# Patient Record
Sex: Female | Born: 1940
Health system: Southern US, Community
[De-identification: ages and names within clinical notes are randomized; demographics above are authoritative.]

## PROBLEM LIST (undated history)

## (undated) DIAGNOSIS — E7800 Pure hypercholesterolemia, unspecified: Secondary | ICD-10-CM

## (undated) DIAGNOSIS — F419 Anxiety disorder, unspecified: Secondary | ICD-10-CM

## (undated) DIAGNOSIS — E2839 Other primary ovarian failure: Secondary | ICD-10-CM

## (undated) DIAGNOSIS — H409 Unspecified glaucoma: Secondary | ICD-10-CM

## (undated) DIAGNOSIS — M1990 Unspecified osteoarthritis, unspecified site: Secondary | ICD-10-CM

## (undated) DIAGNOSIS — Z973 Presence of spectacles and contact lenses: Secondary | ICD-10-CM

## (undated) DIAGNOSIS — R55 Syncope and collapse: Secondary | ICD-10-CM

## (undated) DIAGNOSIS — M810 Age-related osteoporosis without current pathological fracture: Secondary | ICD-10-CM

## (undated) DIAGNOSIS — M8580 Other specified disorders of bone density and structure, unspecified site: Secondary | ICD-10-CM

## (undated) DIAGNOSIS — K5790 Diverticulosis of intestine, part unspecified, without perforation or abscess without bleeding: Secondary | ICD-10-CM

## (undated) DIAGNOSIS — I10 Essential (primary) hypertension: Secondary | ICD-10-CM

## (undated) DIAGNOSIS — G479 Sleep disorder, unspecified: Secondary | ICD-10-CM

## (undated) DIAGNOSIS — I447 Left bundle-branch block, unspecified: Secondary | ICD-10-CM

## (undated) HISTORY — DX: Essential (primary) hypertension: I10

## (undated) HISTORY — DX: Pure hypercholesterolemia, unspecified: E78.00

## (undated) HISTORY — DX: Anxiety disorder, unspecified: F41.9

## (undated) HISTORY — DX: Syncope and collapse: R55

## (undated) HISTORY — DX: Other primary ovarian failure: E28.39

## (undated) HISTORY — DX: Sleep disorder, unspecified: G47.9

## (undated) HISTORY — DX: Diverticulosis of intestine, part unspecified, without perforation or abscess without bleeding: K57.90

## (undated) HISTORY — PX: SHOULDER OPEN ROTATOR CUFF REPAIR: SHX2407

## (undated) HISTORY — DX: Other specified disorders of bone density and structure, unspecified site: M85.80

---
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
## 1991-07-24 HISTORY — PX: CERVICAL LAMINECTOMY: SHX94

## 1995-07-24 HISTORY — PX: LAPAROSCOPIC CHOLECYSTECTOMY: SUR755

## 2000-08-23 HISTORY — PX: COLONOSCOPY: SHX174

## 2001-07-23 HISTORY — PX: SHOULDER OPEN ROTATOR CUFF REPAIR: SHX2407

## 2007-03-25 ENCOUNTER — Encounter: Admission: RE | Admit: 2007-03-25 | Discharge: 2007-03-25 | Payer: Self-pay | Admitting: Orthopedic Surgery

## 2011-06-01 DIAGNOSIS — I447 Left bundle-branch block, unspecified: Secondary | ICD-10-CM | POA: Insufficient documentation

## 2014-07-30 DIAGNOSIS — M8580 Other specified disorders of bone density and structure, unspecified site: Secondary | ICD-10-CM | POA: Diagnosis not present

## 2014-07-30 DIAGNOSIS — G4700 Insomnia, unspecified: Secondary | ICD-10-CM | POA: Diagnosis not present

## 2014-07-30 DIAGNOSIS — E780 Pure hypercholesterolemia: Secondary | ICD-10-CM | POA: Diagnosis not present

## 2014-07-30 DIAGNOSIS — I10 Essential (primary) hypertension: Secondary | ICD-10-CM | POA: Diagnosis not present

## 2014-07-30 DIAGNOSIS — Z0000 Encounter for general adult medical examination without abnormal findings: Secondary | ICD-10-CM | POA: Diagnosis not present

## 2014-09-02 DIAGNOSIS — K5732 Diverticulitis of large intestine without perforation or abscess without bleeding: Secondary | ICD-10-CM | POA: Diagnosis not present

## 2014-10-12 DIAGNOSIS — Z1231 Encounter for screening mammogram for malignant neoplasm of breast: Secondary | ICD-10-CM | POA: Diagnosis not present

## 2015-06-07 DIAGNOSIS — Z0000 Encounter for general adult medical examination without abnormal findings: Secondary | ICD-10-CM | POA: Diagnosis not present

## 2015-08-05 DIAGNOSIS — H5210 Myopia, unspecified eye: Secondary | ICD-10-CM | POA: Diagnosis not present

## 2015-08-05 DIAGNOSIS — H5203 Hypermetropia, bilateral: Secondary | ICD-10-CM | POA: Diagnosis not present

## 2015-08-05 DIAGNOSIS — H401132 Primary open-angle glaucoma, bilateral, moderate stage: Secondary | ICD-10-CM | POA: Diagnosis not present

## 2015-08-11 DIAGNOSIS — Z23 Encounter for immunization: Secondary | ICD-10-CM | POA: Diagnosis not present

## 2015-08-11 DIAGNOSIS — Z79899 Other long term (current) drug therapy: Secondary | ICD-10-CM | POA: Diagnosis not present

## 2015-08-11 DIAGNOSIS — E785 Hyperlipidemia, unspecified: Secondary | ICD-10-CM | POA: Diagnosis not present

## 2015-08-11 DIAGNOSIS — I10 Essential (primary) hypertension: Secondary | ICD-10-CM | POA: Diagnosis not present

## 2015-08-11 DIAGNOSIS — Z1389 Encounter for screening for other disorder: Secondary | ICD-10-CM | POA: Diagnosis not present

## 2015-08-11 DIAGNOSIS — Z0000 Encounter for general adult medical examination without abnormal findings: Secondary | ICD-10-CM | POA: Diagnosis not present

## 2015-08-25 DIAGNOSIS — L578 Other skin changes due to chronic exposure to nonionizing radiation: Secondary | ICD-10-CM | POA: Diagnosis not present

## 2015-08-25 DIAGNOSIS — L728 Other follicular cysts of the skin and subcutaneous tissue: Secondary | ICD-10-CM | POA: Diagnosis not present

## 2015-08-25 DIAGNOSIS — L820 Inflamed seborrheic keratosis: Secondary | ICD-10-CM | POA: Diagnosis not present

## 2015-11-22 DIAGNOSIS — Z1231 Encounter for screening mammogram for malignant neoplasm of breast: Secondary | ICD-10-CM | POA: Diagnosis not present

## 2015-12-08 DIAGNOSIS — G4700 Insomnia, unspecified: Secondary | ICD-10-CM | POA: Diagnosis not present

## 2015-12-08 DIAGNOSIS — Z1389 Encounter for screening for other disorder: Secondary | ICD-10-CM | POA: Diagnosis not present

## 2016-03-13 DIAGNOSIS — N3091 Cystitis, unspecified with hematuria: Secondary | ICD-10-CM | POA: Diagnosis not present

## 2016-04-22 DIAGNOSIS — R55 Syncope and collapse: Secondary | ICD-10-CM

## 2016-04-22 DIAGNOSIS — Z87898 Personal history of other specified conditions: Secondary | ICD-10-CM

## 2016-04-22 HISTORY — DX: Syncope and collapse: R55

## 2016-04-22 HISTORY — DX: Personal history of other specified conditions: Z87.898

## 2016-05-21 DIAGNOSIS — R55 Syncope and collapse: Secondary | ICD-10-CM | POA: Diagnosis not present

## 2016-05-21 DIAGNOSIS — E785 Hyperlipidemia, unspecified: Secondary | ICD-10-CM | POA: Diagnosis not present

## 2016-05-22 ENCOUNTER — Telehealth: Payer: Self-pay | Admitting: Cardiology

## 2016-05-22 NOTE — Telephone Encounter (Signed)
Received records from [facility] for appointment on 05/31/16 with Dr Ellyn Hack.  Records given to [facility] (medical records) for Dr Allison Quarry schedule on 05/31/16. lp

## 2016-05-29 ENCOUNTER — Encounter: Payer: Self-pay | Admitting: *Deleted

## 2016-05-29 ENCOUNTER — Other Ambulatory Visit: Payer: Self-pay | Admitting: *Deleted

## 2016-05-31 ENCOUNTER — Ambulatory Visit (INDEPENDENT_AMBULATORY_CARE_PROVIDER_SITE_OTHER): Payer: Commercial Managed Care - HMO | Admitting: Cardiology

## 2016-05-31 ENCOUNTER — Encounter: Payer: Self-pay | Admitting: Cardiology

## 2016-05-31 VITALS — BP 154/78 | HR 56 | Ht 65.0 in | Wt 154.0 lb

## 2016-05-31 DIAGNOSIS — I208 Other forms of angina pectoris: Secondary | ICD-10-CM

## 2016-05-31 DIAGNOSIS — R55 Syncope and collapse: Secondary | ICD-10-CM | POA: Diagnosis not present

## 2016-05-31 DIAGNOSIS — R0789 Other chest pain: Secondary | ICD-10-CM | POA: Insufficient documentation

## 2016-05-31 DIAGNOSIS — I447 Left bundle-branch block, unspecified: Secondary | ICD-10-CM

## 2016-05-31 NOTE — Patient Instructions (Signed)
SCHEDULE AT [place] has requested that you have an echocardiogram. Echocardiography is a painless test that uses sound waves to create images of your heart. It provides your doctor with information about the size and shape of your heart and how well your heart's chambers and valves are working. This procedure takes approximately one hour. There are no restrictions for this procedure.   SCHEDULE AT [facility] Your physician has requested that you have a carotid duplex. This test is an ultrasound of the carotid arteries in your neck. It looks at blood flow through these arteries that supply the brain with blood. Allow one hour for this exam. There are no restrictions or special instructions.  Your physician has requested that you have a lexiscan myoview. For further information please visit [email or web address]. Please follow instruction sheet, as given.   NO OTHER CHANGES  Your physician recommends that you schedule a follow-up appointment in: [place].

## 2016-05-31 NOTE — Progress Notes (Signed)
PCP: Ronita Hipps, MD  Clinic Note: Chief Complaint  Patient presents with  . Follow-up    New patient. Eval for syncope and collapse. Dr. Helene Kelp ref.    HPI: Leslie Parsons is a 75 y.o. female with a PMH below who presents today for Cardiology evaluation after an episode of syncope associated with neck and jaw pain during exercise. She has chronic left bundle-branch block that was first noted in 2012.Marland Kitchen  Leslie Parsons was seen on October 30 by her PCP following an episode of syncope. This occurred while doing yoga at the [facility]. She was having neck pain with doing arm lifting exercises.  This symptom that she describes occurred when she was doing exercises using rubber straps that required her to hold her arms up and strap. She initially started feeling some discomfort in the side of her neck and back. She tried to stretch, and restart only to have the symptom recur and can go into her jaw. She then started feeling nauseated and dizzy. She felt flushed as though she may pass out. When she turned around to walk out of the room to get some fresh air and water, she passed out before getting out of the door. She apparently lost consciousness for roughly 3-4 minutes. She was evaluated by a physician and have seen through indicated that she never lost pulse. She refused to come to the emergency room because she woke up and felt better. She had no postictal symptoms, but does not have any recollection or memory of anything that happened after she started feeling nauseated. EMS evaluation demonstrated a left bundle branch block on her EKG. They have the past and seen from [facility] and EKG the showed left bundle branch block from 2012 (scanned in the chart)  She was not hospitalized, has not had any studies.  Interval History: Since this episode, she has not had any further symptoms. She has been gone back to doing her routine yoga routine with her daughter. She has not had any further neck or  jaw discomfort. She denies any resting or exertional dyspnea or chest tightness or pressure. Besides having bilateral varicose veins or legs that are a little bit uncomfortable, she really does well she doesn't really have any edema. Certainly no PND or orthopnea. No palpitations or recurrent syncope/near syncope. No TIA/amaurosis fugax symptoms.  ROS: A comprehensive was performed. Review of Systems  Constitutional: Negative for chills, fever, malaise/fatigue and weight loss.  HENT: Negative for congestion and nosebleeds.   Respiratory: Negative.   Cardiovascular: Positive for leg swelling (Mild swelling with bilateral varicose veins that are a little bit sore, but no leg heaviness, restless leg symptoms or wounds. ).       Per history of present illness  Gastrointestinal: Positive for nausea (Only per history of present illness). Negative for abdominal pain, blood in stool and melena.  Genitourinary: Negative for dysuria, frequency and hematuria.  Musculoskeletal: Positive for myalgias (Only the neck, shoulder and jaw pain at the time of her syncopal episode). Negative for joint pain.  Skin: Negative.        No venous stasis dermatitis changes.  Neurological: Positive for loss of consciousness (As described in history of present illness). Negative for focal weakness and headaches.       Since the syncopal episode, no further symptoms.  Psychiatric/Behavioral: Negative for depression and memory loss. The patient is not nervous/anxious and does not have insomnia.   All other systems reviewed and are negative.  Past Medical History:  Diagnosis Date  . Anxiety   . Diverticulosis   . Hypertension   . Osteopenia   . Ovarian failure   . Pure hypercholesterolemia   . Sleep disorder   . Syncope and collapse 04/2016   Unclear etiology    Past Surgical History:  Procedure Laterality Date  . APPENDECTOMY  1948  . CERVICAL LAMINECTOMY  1993  . COLONOSCOPY  08/2000  . LAPAROSCOPIC  CHOLECYSTECTOMY  1997  . SHOULDER OPEN ROTATOR CUFF REPAIR  2003   Prior to Admission medications   Medication Sig Start Date End Date Taking? Authorizing Provider  benazepril-hydrochlorthiazide (LOTENSIN HCT) 20-12.5 MG tablet Take 1 tablet by mouth daily.   Yes Historical Provider, MD  latanoprost (XALATAN) 0.005 % ophthalmic solution Place 1 drop into both eyes at bedtime.   Yes Historical Provider, MD  LORazepam (ATIVAN) 0.5 MG tablet Take 0.5 mg by mouth daily as needed for anxiety.   Yes Historical Provider, MD   Allergies  Allergen Reactions  . Codeine Other (See Comments)    Doesn't tolerate    Social History   Social History  . Marital status: Unknown    Spouse name: N/A  . Number of children: 4  . Years of education: 12   Occupational History  . [place] - retired   Social History Main Topics  . Smoking status: Never Smoker  . Smokeless tobacco: Never Used  . Alcohol use No  . Drug use: No  . Sexual activity: Not Asked   Other Topics Concern  . 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 She never smoked and does not drink. She exercises 5 days of the week with her daughter at the [facility] (oftentimes doing yoga or other exercise classes.   She does have a living will and power of attorney. Per her PCPs records she has been listed as DO NOT RESUSCITATE.    Family History  Problem Relation Age of Onset  . Heart disease Mother   . Heart failure Mother   . Emphysema Father 64  . Alzheimer's disease Sister   . Coronary artery disease Sister     History of PCI in 2008  . CVA Brother 28  . Alzheimer's disease Maternal Grandmother   . Stroke Paternal Grandmother   . Cancer Sister     Wt Readings from Last 3 Encounters:  05/31/16 69.9 kg (154 lb)    PHYSICAL EXAM BP (!) 154/78   Pulse (!) 56   Ht 5\' 5"  (1.651 m)   Wt 69.9 kg (154 lb)   BMI  25.63 kg/m  General appearance: alert, cooperative, appears stated age, no distress and L3 appearing/well-nourished and well-groomed HEENT: [place]/AT, EOMI, MMM, anicteric sclera Neck: no adenopathy, no carotid bruit and no JVD Lungs: clear to auscultation bilaterally, normal percussion bilaterally and non-labored Heart: regular rate and rhythm, S1 normal and split S2, no murmur, click, rub or gallop; nondisplaced PMI Abdomen: soft, non-tender; bowel sounds normal; no masses,  no organomegaly; no HJR Extremities: extremities normal, atraumatic, no cyanosis, and edema -trace Pulses: 2+ and symmetric;  Skin: mobility and turgor normal, no evidence of bleeding or bruising and no lesions noted or varicosities - lower leg(s) Mildly engorged, but nontender Neurologic: Mental status: Alert, oriented, thought content appropriate; normal, steady gait Cranial nerves: normal (II-XII grossly intact)   Adult ECG  Report  Rate: 54 ;  Rhythm: sinus bradycardia and LBBB. Normal axis, intervals and durations.;   Narrative Interpretation: Relatively stable EKG compared to her EKG from 2012 which showed almost identical findings with bradycardia 56 beats minute.   Other studies Reviewed: Additional studies/ records that were reviewed today include:  Recent Labs: 05/21/2016 -see report scanned in chart  Na+ 139, K+ 4.2, Cl- 103, HCO3- 29 , BUN 17, Cr 1.18, Glu 103, Ca2+ 9.5; AST 15, ALT 14, AlkP 51, Alb 4.5, TP 6.8, T Bili 0.5  CBC: W 4.4, H/H 13.2/36.7, Plt 235 K;;; TSH 0.819  TC 220, TG 92, HDL 84, LDL 118   ASSESSMENT / PLAN: Problem List Items Addressed This Visit    Left bundle branch block (LBBB) on electrocardiogram (Chronic)    This appears to be a long-standing finding, however she has never had cardiac evaluation. Would like to make sure that she does not have evidence of cardiomyopathy.   Plan: 2-D echocardiogram      Relevant Orders   EKG 12-Lead   Myocardial Perfusion Imaging    ECHOCARDIOGRAM COMPLETE   VAS US CAROTID   Syncope and collapse - Primary    Very difficult to determine the true etiology for her syncope. Most likely is consistent with strain related muscle skeletal pain leading to a vasovagal episode. This is probably exacerbated by the fact that it was early morning and she had not had much to eat or drink. However given the fact that there was neck and jaw pain with exertion followed by nausea and collapse, we cannot exclude an ischemic arrhythmic event. Can also not exclude subclavian steal. With her left bundle branch block, we cannot exclude cardiomyopathy. Plan: Myoview - to evaluate for ischemia, echocardiogram to evaluate cardiomyopathy from left bundle branch block, carotid/upper extremity Dopplers to exclude subclavian steal      Relevant Medications   benazepril-hydrochlorthiazide (LOTENSIN HCT) 20-12.5 MG tablet   Other Relevant Orders   EKG 12-Lead   Myocardial Perfusion Imaging   ECHOCARDIOGRAM COMPLETE   VAS US CAROTID   Exertional angina (HCC) - cannot exclude with neck pain preceding syncope     It's hard to tell what caused her neck and jaw pain. Probably was related to the exercise she was doing it is musculoskeletal, however the fact that it was then associated with a syncopal episode, we cannot exclude it being a potential anginal equivalent. She does have an abnormal EKG with left bundle branch block as well as long-standing hypertension and family history of heart disease.  Plan: Evaluate for ischemia with Myoview       Relevant Medications   benazepril-hydrochlorthiazide (LOTENSIN HCT) 20-12.5 MG tablet   Other Relevant Orders   EKG 12-Lead   Myocardial Perfusion Imaging   ECHOCARDIOGRAM COMPLETE   VAS US CAROTID      Current medicines are reviewed at length with the patient today. (+/- concerns) None The following changes have been made:   Patient Instructions  SCHEDULE AT [facility] has requested that you have an echocardiogram. Echocardiography is a painless test that uses sound waves to create images of your heart. It provides your doctor with information about the size and shape of your heart and how well your heart's chambers and valves are working. This procedure takes approximately one hour. There are no restrictions for this procedure.   SCHEDULE AT [place] Your physician has requested that you have a carotid  duplex. This test is an ultrasound of the carotid arteries in your neck. It looks at blood flow through these arteries that supply the brain with blood. Allow one hour for this exam. There are no restrictions or special instructions.  Your physician has requested that you have a lexiscan myoview. For further information please visit [email or web address]. Please follow instruction sheet, as given.   NO OTHER CHANGES  Your physician recommends that you schedule a follow-up appointment in: [place].    Studies Ordered:   Orders Placed This Encounter  Procedures  . Myocardial Perfusion Imaging  . EKG 12-Lead  . ECHOCARDIOGRAM COMPLETE      Glenetta Hew, M.D., M.S. Interventional Cardiologist   Pager # [phone] Phone # [phone] [address] [place], [place] [address]

## 2016-06-01 ENCOUNTER — Encounter: Payer: Self-pay | Admitting: Cardiology

## 2016-06-01 NOTE — Assessment & Plan Note (Signed)
Very difficult to determine the true etiology for her syncope. Most likely is consistent with strain related muscle skeletal pain leading to a vasovagal episode. This is probably exacerbated by the fact that it was early morning and she had not had much to eat or drink. However given the fact that there was neck and jaw pain with exertion followed by nausea and collapse, we cannot exclude an ischemic arrhythmic event. Can also not exclude subclavian steal. With her left bundle branch block, we cannot exclude cardiomyopathy. Plan: Myoview - to evaluate for ischemia, echocardiogram to evaluate cardiomyopathy from left bundle branch block, carotid/upper extremity Dopplers to exclude subclavian steal

## 2016-06-01 NOTE — Assessment & Plan Note (Signed)
This appears to be a long-standing finding, however she has never had cardiac evaluation. Would like to make sure that she does not have evidence of cardiomyopathy.   Plan: 2-D echocardiogram

## 2016-06-01 NOTE — Assessment & Plan Note (Signed)
It's hard to tell what caused her neck and jaw pain. Probably was related to the exercise she was doing it is musculoskeletal, however the fact that it was then associated with a syncopal episode, we cannot exclude it being a potential anginal equivalent. She does have an abnormal EKG with left bundle branch block as well as long-standing hypertension and family history of heart disease.  Plan: Evaluate for ischemia with Myoview

## 2016-06-07 ENCOUNTER — Encounter: Payer: Self-pay | Admitting: *Deleted

## 2016-06-26 ENCOUNTER — Telehealth (HOSPITAL_COMMUNITY): Payer: Self-pay

## 2016-06-26 NOTE — Telephone Encounter (Signed)
Encounter complete. 

## 2016-06-27 ENCOUNTER — Ambulatory Visit (HOSPITAL_COMMUNITY): Payer: Commercial Managed Care - HMO | Attending: Cardiology

## 2016-06-27 ENCOUNTER — Other Ambulatory Visit: Payer: Self-pay

## 2016-06-27 DIAGNOSIS — I208 Other forms of angina pectoris: Secondary | ICD-10-CM | POA: Insufficient documentation

## 2016-06-27 DIAGNOSIS — I272 Pulmonary hypertension, unspecified: Secondary | ICD-10-CM | POA: Diagnosis not present

## 2016-06-27 DIAGNOSIS — R55 Syncope and collapse: Secondary | ICD-10-CM | POA: Diagnosis not present

## 2016-06-27 DIAGNOSIS — I447 Left bundle-branch block, unspecified: Secondary | ICD-10-CM | POA: Diagnosis not present

## 2016-06-27 DIAGNOSIS — I2089 Other forms of angina pectoris: Secondary | ICD-10-CM

## 2016-06-28 ENCOUNTER — Ambulatory Visit (HOSPITAL_COMMUNITY)
Admission: RE | Admit: 2016-06-28 | Discharge: 2016-06-28 | Disposition: A | Discharge status: Home / Self Care | Payer: Commercial Managed Care - HMO | Source: Ambulatory Visit | Attending: Cardiology | Admitting: Cardiology

## 2016-06-28 ENCOUNTER — Other Ambulatory Visit: Payer: Self-pay | Admitting: Cardiology

## 2016-06-28 DIAGNOSIS — R55 Syncope and collapse: Secondary | ICD-10-CM | POA: Diagnosis not present

## 2016-06-28 DIAGNOSIS — I447 Left bundle-branch block, unspecified: Secondary | ICD-10-CM | POA: Diagnosis not present

## 2016-06-28 DIAGNOSIS — I208 Other forms of angina pectoris: Secondary | ICD-10-CM | POA: Diagnosis not present

## 2016-06-28 DIAGNOSIS — I6523 Occlusion and stenosis of bilateral carotid arteries: Secondary | ICD-10-CM | POA: Diagnosis not present

## 2016-06-28 LAB — MYOCARDIAL PERFUSION IMAGING
CHL CUP NUCLEAR SDS: 3
CHL CUP NUCLEAR SRS: 7
CHL CUP NUCLEAR SSS: 10
CHL CUP RESTING HR STRESS: 61 {beats}/min
CHL CUP STRESS STAGE 1 DBP: 86 mmHg
CHL CUP STRESS STAGE 1 GRADE: 0 %
CHL CUP STRESS STAGE 1 HR: 56 {beats}/min
CHL CUP STRESS STAGE 1 SBP: 150 mmHg
CHL CUP STRESS STAGE 1 SPEED: 0 mph
CHL CUP STRESS STAGE 2 GRADE: 0 %
CHL CUP STRESS STAGE 2 HR: 56 {beats}/min
CHL CUP STRESS STAGE 3 DBP: 63 mmHg
CHL CUP STRESS STAGE 3 GRADE: 0 %
CHL CUP STRESS STAGE 4 DBP: 73 mmHg
CHL CUP STRESS STAGE 4 SBP: 146 mmHg
CHL CUP STRESS STAGE 4 SPEED: 0 mph
Estimated workload: 1 METS
LV dias vol: 82 mL (ref 46–106)
LV sys vol: 37 mL
Peak BP: 153 mmHg
Peak HR: 87 {beats}/min
Percent of predicted max HR: 60 %
Stage 2 Speed: 0 mph
Stage 3 HR: 87 {beats}/min
Stage 3 SBP: 153 mmHg
Stage 3 Speed: 0 mph
Stage 4 Grade: 0 %
Stage 4 HR: 73 {beats}/min
TID: 0.99

## 2016-06-28 MED ORDER — TECHNETIUM TC 99M TETROFOSMIN IV KIT
10.8000 | PACK | Freq: Once | INTRAVENOUS | Status: AC | PRN
  Administered 2016-06-28: 10.8 via INTRAVENOUS
  Filled 2016-06-28: qty 11

## 2016-06-28 MED ORDER — TECHNETIUM TC 99M TETROFOSMIN IV KIT
32.7000 | PACK | Freq: Once | INTRAVENOUS | Status: AC | PRN
  Administered 2016-06-28: 32.7 via INTRAVENOUS
  Filled 2016-06-28: qty 33

## 2016-06-28 MED ORDER — REGADENOSON 0.4 MG/5ML IV SOLN
0.4000 mg | Freq: Once | INTRAVENOUS | Status: AC
  Administered 2016-06-28: 0.4 mg via INTRAVENOUS

## 2016-07-04 DIAGNOSIS — Z23 Encounter for immunization: Secondary | ICD-10-CM | POA: Diagnosis not present

## 2016-07-10 ENCOUNTER — Telehealth: Payer: Self-pay | Admitting: *Deleted

## 2016-07-10 NOTE — Telephone Encounter (Signed)
LEFT MESSAGE ON BOTH [facility] MAIL TO CALL BACK - IN REGARDS TO CAROTID RESULTS.

## 2016-07-10 NOTE — Telephone Encounter (Signed)
Pt returned call to [facility] regarding Carotid results-pls call [phone]

## 2016-07-10 NOTE — Telephone Encounter (Signed)
Spoke to patient. Result given . Verbalized understanding  

## 2016-07-10 NOTE — Telephone Encounter (Signed)
-----   Message from Leonie Man, MD sent at 07/04/2016 11:08 PM EST ----- Mild bilateral carotid artery plaque.  No significant blockages.  DH  pls forward to Dr. Kennith Maes

## 2016-07-11 ENCOUNTER — Ambulatory Visit (INDEPENDENT_AMBULATORY_CARE_PROVIDER_SITE_OTHER): Payer: Commercial Managed Care - HMO | Admitting: Cardiology

## 2016-07-11 ENCOUNTER — Encounter: Payer: Self-pay | Admitting: Cardiology

## 2016-07-11 VITALS — BP 122/82 | HR 64 | Ht 65.0 in | Wt 157.4 lb

## 2016-07-11 DIAGNOSIS — I447 Left bundle-branch block, unspecified: Secondary | ICD-10-CM

## 2016-07-11 DIAGNOSIS — R55 Syncope and collapse: Secondary | ICD-10-CM | POA: Diagnosis not present

## 2016-07-11 DIAGNOSIS — R0789 Other chest pain: Secondary | ICD-10-CM | POA: Diagnosis not present

## 2016-07-11 NOTE — Progress Notes (Signed)
PCP: Ronita Hipps, MD  Clinic Note: Chief Complaint  Patient presents with  . Follow-up    patient reports no complaints    HPI: Leslie Parsons is a 75 y.o. female with a PMH below who presents today for 1 month follow-up after carotid ultrasound and myocardial perfusion scan.  Leslie Parsons was last seen on November 9 for initial cardiology consultation. She was doing stretching exercises to prevent. She apparently had passed out 3-4 minutes. EKG demonstrated known left bundle branch block.  Recent Hospitalizations: None  Studies Reviewed: December 6 and 7 2017  Myoview: Mildly reduced EF ~54%. Medium sized moderate severity defect in the basal anteroseptal, basal inferoseptal, mid inferoseptal and mid anteroseptal wall as well as anteroapical and apical septal wall that was not reversible. Likely relating to breast attenuation versus bundle branch block. LOW Risk  2-D echo: EF roughly 45% with septal synchrony and diffuse hypokinesis - likely related to bundle branch block. Mildly elevated PA pressures  Carotid Dopplers: No significant stenoses. Mild bilateral plaque.  Interval History:  Leslie Parsons returns today for follow-up after her studies. Despite having the recent scary episode, she and her daughter gone back to doing her routine exercise, and she has not had any further symptoms of chest tightness, pressure or syncope/near syncope. She is exercising without difficulty. No resting or exertional dyspnea.  No PND, orthopnea or edema. No palpitations, lightheadedness, dizziness, weakness or TIA/amaurosis fugax symptoms. No claudication.  She really is doing well with no active symptoms.  ROS: A comprehensive was performed. Review of Systems  Constitutional: Negative.  Negative for malaise/fatigue.  HENT: Negative for congestion and sinus pain.   Respiratory: Negative.  Negative for cough and shortness of breath.   Cardiovascular: Positive for leg swelling ( (Mild swelling  with bilateral varicose veins that are a little bit sore, but no leg heaviness, restless leg symptoms or wounds. ). ).  Gastrointestinal: Negative for blood in stool, constipation, heartburn and melena.  Genitourinary: Negative.  Negative for hematuria.  Musculoskeletal: Negative for joint pain and myalgias.  Neurological: Negative.  Negative for dizziness.  Psychiatric/Behavioral: Negative.  Negative for memory loss.  All other systems reviewed and are negative.   Past Medical History:  Diagnosis Date  . Anxiety   . Diverticulosis   . Hypertension   . Osteopenia   . Ovarian failure   . Pure hypercholesterolemia   . Sleep disorder   . Syncope and collapse 04/2016   Unclear etiology    Past Surgical History:  Procedure Laterality Date  . APPENDECTOMY  1948  . CERVICAL LAMINECTOMY  1993  . COLONOSCOPY  08/2000  . LAPAROSCOPIC CHOLECYSTECTOMY  1997  . SHOULDER OPEN ROTATOR CUFF REPAIR  2003    Current Meds  Medication Sig  . benazepril-hydrochlorthiazide (LOTENSIN HCT) 20-12.5 MG tablet Take 1 tablet by mouth daily.  Marland Kitchen latanoprost (XALATAN) 0.005 % ophthalmic solution Place 1 drop into both eyes at bedtime.  Marland Kitchen LORazepam (ATIVAN) 0.5 MG tablet Take 0.5 mg by mouth daily as needed for anxiety.    Allergies  Allergen Reactions  . Codeine Other (See Comments)    Doesn't tolerate    Social History   Social History  . Marital status: Unknown    Spouse name: N/A  . Number of children: 4  . Years of education: 12   Occupational History  . [employer] - retired   Social History Main Topics  . Smoking status: Never  Smoker  . Smokeless tobacco: Never Used  . Alcohol use No  . Drug use: No  . Sexual activity: Not Asked   Other Topics Concern  . 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 She never smoked and does not drink. She exercises 5 days of the  week with her daughter at the [facility] (oftentimes doing yoga or other exercise classes.   She does have a living will and power of attorney. Per her PCPs records she has been listed as DO NOT RESUSCITATE.    family history includes Alzheimer's disease in her maternal grandmother and sister; CVA (age of onset: 15) in her brother; Cancer in her sister; Coronary artery disease in her sister; Emphysema (age of onset: 7) in her father; Heart disease in her mother; Heart failure in her mother; Stroke in her paternal grandmother.  Wt Readings from Last 3 Encounters:  07/11/16 71.4 kg (157 lb 6.4 oz)  06/28/16 69.9 kg (154 lb)  05/31/16 69.9 kg (154 lb)    PHYSICAL EXAM BP 122/82   Pulse 64   Ht 5\' 5"  (1.651 m)   Wt 71.4 kg (157 lb 6.4 oz)   BMI 26.19 kg/m  General appearance: alert, cooperative, appears stated age, no distress and L3 appearing/well-nourished and well-groomed Neck: no adenopathy, no carotid bruit and no JVD Lungs: clear to auscultation bilaterally, normal percussion bilaterally and non-labored Heart: regular rate and rhythm, S1 normal and split S2, no murmur, click, rub or gallop; nondisplaced PMI Abdomen: soft, non-tender; bowel sounds normal; no masses,  no organomegaly; no HJR Extremities: extremities normal, atraumatic, no cyanosis, and edema -trace Pulses: 2+ and symmetric;  Neurologic: Mental status: Alert, oriented, thought content appropriate; normal, steady gait    Adult ECG Report n/a  Other studies Reviewed: Additional studies/ records that were reviewed today include:  Recent Labs:   No results found for: CHOL, HDL, LDLCALC, LDLDIRECT, TRIG, CHOLHDL    ASSESSMENT / PLAN: Unable to really determine what the cause of her symptoms otherwise. At least we know it was probably not lately ischemic heart disease or carotid/subclavian disease.  So far, her workup is negative. I think she is fine returning to the care of his primary physician.  Problem List Items  Addressed This Visit    Left bundle branch block (LBBB) on electrocardiogram (Chronic)    Only septal to syncope noted on echo with mild diffuse hypokinesis from that. Related to bundle branch block.      Syncope and collapse - Primary    No further episodes. Probably related to exertion and then extreme pain leading to vasovagal episode. This is in the setting of also probably being dehydrated after exercise. Carotid Dopplers did not show any evidence of stenosis to suggest subclavian steal. Normal echo and normal stress test.       Atypical chest pain    Negative Myoview. This would suggest that her symptoms probably not angina and probably was simply just musculoskeletal neck pain.  Myoview only suggested septal dropout from bundle branch block. No evidence of ischemia.         Current medicines are reviewed at length with the patient today. (+/- concerns) None The following changes have been made: None  Patient Instructions  NO CHANGES WITH CURRENT TREATMENT  Your physician recommends that you schedule a follow-up appointment on an as needed basis  Studies Ordered:   No orders of  the defined types were placed in this encounter.    Glenetta Hew, M.D., M.S. Interventional Cardiologist   Pager # [phone] Phone # [phone] [address] [place], [place] [address]

## 2016-07-11 NOTE — Patient Instructions (Addendum)
NO CHANGES WITH CURRENT TREATMENT    Your physician recommends that you schedule a follow-up appointment on an as needed basis.   

## 2016-07-13 ENCOUNTER — Encounter: Payer: Self-pay | Admitting: Cardiology

## 2016-07-13 NOTE — Assessment & Plan Note (Addendum)
Negative Myoview. This would suggest that her symptoms probably not angina and probably was simply just musculoskeletal neck pain.  Myoview only suggested septal dropout from bundle branch block. No evidence of ischemia.

## 2016-07-13 NOTE — Assessment & Plan Note (Signed)
Only septal to syncope noted on echo with mild diffuse hypokinesis from that. Related to bundle branch block.

## 2016-07-13 NOTE — Assessment & Plan Note (Signed)
No further episodes. Probably related to exertion and then extreme pain leading to vasovagal episode. This is in the setting of also probably being dehydrated after exercise. Carotid Dopplers did not show any evidence of stenosis to suggest subclavian steal. Normal echo and normal stress test.

## 2016-10-25 DIAGNOSIS — H524 Presbyopia: Secondary | ICD-10-CM | POA: Diagnosis not present

## 2016-10-31 DIAGNOSIS — M1712 Unilateral primary osteoarthritis, left knee: Secondary | ICD-10-CM | POA: Diagnosis not present

## 2016-11-29 DIAGNOSIS — H1045 Other chronic allergic conjunctivitis: Secondary | ICD-10-CM | POA: Diagnosis not present

## 2016-12-25 DIAGNOSIS — Z0000 Encounter for general adult medical examination without abnormal findings: Secondary | ICD-10-CM | POA: Diagnosis not present

## 2016-12-25 DIAGNOSIS — Z6825 Body mass index (BMI) 25.0-25.9, adult: Secondary | ICD-10-CM | POA: Diagnosis not present

## 2016-12-25 DIAGNOSIS — G4700 Insomnia, unspecified: Secondary | ICD-10-CM | POA: Diagnosis not present

## 2016-12-25 DIAGNOSIS — I10 Essential (primary) hypertension: Secondary | ICD-10-CM | POA: Diagnosis not present

## 2016-12-25 DIAGNOSIS — Z79899 Other long term (current) drug therapy: Secondary | ICD-10-CM | POA: Diagnosis not present

## 2016-12-25 DIAGNOSIS — E785 Hyperlipidemia, unspecified: Secondary | ICD-10-CM | POA: Diagnosis not present

## 2016-12-25 DIAGNOSIS — Z9181 History of falling: Secondary | ICD-10-CM | POA: Diagnosis not present

## 2017-01-22 DIAGNOSIS — Z1231 Encounter for screening mammogram for malignant neoplasm of breast: Secondary | ICD-10-CM | POA: Diagnosis not present

## 2017-03-07 DIAGNOSIS — M1712 Unilateral primary osteoarthritis, left knee: Secondary | ICD-10-CM | POA: Diagnosis not present

## 2017-03-19 DIAGNOSIS — C44311 Basal cell carcinoma of skin of nose: Secondary | ICD-10-CM | POA: Diagnosis not present

## 2017-03-19 DIAGNOSIS — L820 Inflamed seborrheic keratosis: Secondary | ICD-10-CM | POA: Diagnosis not present

## 2017-04-25 DIAGNOSIS — H401132 Primary open-angle glaucoma, bilateral, moderate stage: Secondary | ICD-10-CM | POA: Diagnosis not present

## 2017-08-06 DIAGNOSIS — Z23 Encounter for immunization: Secondary | ICD-10-CM | POA: Diagnosis not present

## 2017-11-25 DIAGNOSIS — H5203 Hypermetropia, bilateral: Secondary | ICD-10-CM | POA: Diagnosis not present

## 2018-01-20 DIAGNOSIS — I8393 Asymptomatic varicose veins of bilateral lower extremities: Secondary | ICD-10-CM | POA: Diagnosis not present

## 2018-01-20 DIAGNOSIS — G4700 Insomnia, unspecified: Secondary | ICD-10-CM | POA: Diagnosis not present

## 2018-01-20 DIAGNOSIS — Z0000 Encounter for general adult medical examination without abnormal findings: Secondary | ICD-10-CM | POA: Diagnosis not present

## 2018-01-20 DIAGNOSIS — E785 Hyperlipidemia, unspecified: Secondary | ICD-10-CM | POA: Diagnosis not present

## 2018-01-20 DIAGNOSIS — Z1331 Encounter for screening for depression: Secondary | ICD-10-CM | POA: Diagnosis not present

## 2018-01-20 DIAGNOSIS — Z1339 Encounter for screening examination for other mental health and behavioral disorders: Secondary | ICD-10-CM | POA: Diagnosis not present

## 2018-01-20 DIAGNOSIS — I10 Essential (primary) hypertension: Secondary | ICD-10-CM | POA: Diagnosis not present

## 2018-01-20 DIAGNOSIS — Z6825 Body mass index (BMI) 25.0-25.9, adult: Secondary | ICD-10-CM | POA: Diagnosis not present

## 2018-01-20 DIAGNOSIS — Z79899 Other long term (current) drug therapy: Secondary | ICD-10-CM | POA: Diagnosis not present

## 2018-01-28 DIAGNOSIS — L820 Inflamed seborrheic keratosis: Secondary | ICD-10-CM | POA: Diagnosis not present

## 2018-01-28 DIAGNOSIS — D485 Neoplasm of uncertain behavior of skin: Secondary | ICD-10-CM | POA: Diagnosis not present

## 2018-05-14 DIAGNOSIS — Z1231 Encounter for screening mammogram for malignant neoplasm of breast: Secondary | ICD-10-CM | POA: Diagnosis not present

## 2018-05-18 IMAGING — NM NM MISC PROCEDURE
6 series · 36 of 36 positions shown · non-contrast
Comparison: none

[Series 1: wbr rest · 6.40mm/px · 6 of 64 frames shown]
[frame 6/64]
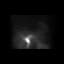
[frame 16/64]
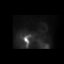
[frame 27/64]
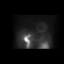
[frame 38/64]
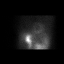
[frame 48/64]
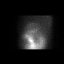
[frame 59/64]
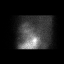

[Series 1: wbr_r-proj_st wbr rest · 6.40mm/px · 6 of 64 frames shown]
[frame 6/64]
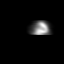
[frame 16/64]
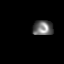
[frame 27/64]
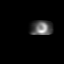
[frame 38/64]
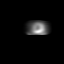
[frame 48/64]
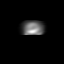
[frame 59/64]
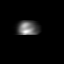

[Series 2: wbr stress-gsp · 6.40mm/px · 6 of 512 frames shown]
[frame 43/512]
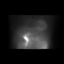
[frame 128/512]
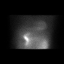
[frame 214/512]
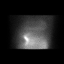
[frame 299/512]
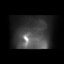
[frame 384/512]
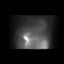
[frame 470/512]
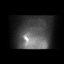

[Series 2: wbr_s-proj_st wbr stress-gsp · 6.40mm/px · 6 of 512 frames shown]
[frame 43/512]
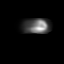
[frame 128/512]
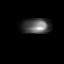
[frame 214/512]
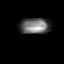
[frame 299/512]
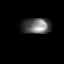
[frame 384/512]
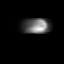
[frame 470/512]
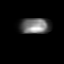

[Series 3: wbr stress-sum-em · 6.40mm/px · 6 of 64 frames shown]
[frame 6/64]
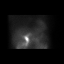
[frame 16/64]
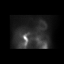
[frame 27/64]
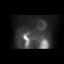
[frame 38/64]
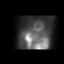
[frame 48/64]
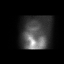
[frame 59/64]
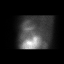

[Series 3: wbr_s-proj_st wbr stress-sum-em · 6.40mm/px · 6 of 64 frames shown]
[frame 6/64]
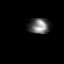
[frame 16/64]
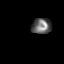
[frame 27/64]
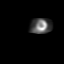
[frame 38/64]
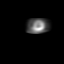
[frame 48/64]
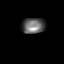
[frame 59/64]
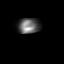

[36 of 36 positions shown; findings below may reference images not displayed]

Canned report from images found in remote index.

Refer to host system for actual result text.

## 2018-05-23 DIAGNOSIS — C50411 Malignant neoplasm of upper-outer quadrant of right female breast: Secondary | ICD-10-CM

## 2018-05-23 DIAGNOSIS — Z170 Estrogen receptor positive status [ER+]: Secondary | ICD-10-CM

## 2018-05-23 HISTORY — DX: Estrogen receptor positive status (ER+): Z17.0

## 2018-05-23 HISTORY — DX: Malignant neoplasm of upper-outer quadrant of right female breast: C50.411

## 2018-06-02 DIAGNOSIS — H401132 Primary open-angle glaucoma, bilateral, moderate stage: Secondary | ICD-10-CM | POA: Diagnosis not present

## 2018-06-04 DIAGNOSIS — R928 Other abnormal and inconclusive findings on diagnostic imaging of breast: Secondary | ICD-10-CM | POA: Diagnosis not present

## 2018-06-04 DIAGNOSIS — R922 Inconclusive mammogram: Secondary | ICD-10-CM | POA: Diagnosis not present

## 2018-06-04 DIAGNOSIS — N6311 Unspecified lump in the right breast, upper outer quadrant: Secondary | ICD-10-CM | POA: Diagnosis not present

## 2018-06-11 DIAGNOSIS — R928 Other abnormal and inconclusive findings on diagnostic imaging of breast: Secondary | ICD-10-CM | POA: Diagnosis not present

## 2018-06-11 DIAGNOSIS — D241 Benign neoplasm of right breast: Secondary | ICD-10-CM | POA: Diagnosis not present

## 2018-06-11 DIAGNOSIS — C50411 Malignant neoplasm of upper-outer quadrant of right female breast: Secondary | ICD-10-CM | POA: Diagnosis not present

## 2018-06-11 DIAGNOSIS — N6311 Unspecified lump in the right breast, upper outer quadrant: Secondary | ICD-10-CM | POA: Diagnosis not present

## 2018-06-17 DIAGNOSIS — Z170 Estrogen receptor positive status [ER+]: Secondary | ICD-10-CM | POA: Diagnosis not present

## 2018-06-17 DIAGNOSIS — C50411 Malignant neoplasm of upper-outer quadrant of right female breast: Secondary | ICD-10-CM | POA: Diagnosis not present

## 2018-06-18 DIAGNOSIS — C50411 Malignant neoplasm of upper-outer quadrant of right female breast: Secondary | ICD-10-CM | POA: Insufficient documentation

## 2018-06-23 DIAGNOSIS — Z01810 Encounter for preprocedural cardiovascular examination: Secondary | ICD-10-CM | POA: Diagnosis not present

## 2018-06-23 DIAGNOSIS — R9431 Abnormal electrocardiogram [ECG] [EKG]: Secondary | ICD-10-CM | POA: Diagnosis not present

## 2018-06-23 DIAGNOSIS — I10 Essential (primary) hypertension: Secondary | ICD-10-CM | POA: Diagnosis not present

## 2018-06-23 DIAGNOSIS — C50211 Malignant neoplasm of upper-inner quadrant of right female breast: Secondary | ICD-10-CM | POA: Diagnosis not present

## 2018-06-23 DIAGNOSIS — I493 Ventricular premature depolarization: Secondary | ICD-10-CM | POA: Diagnosis not present

## 2018-06-23 DIAGNOSIS — C50411 Malignant neoplasm of upper-outer quadrant of right female breast: Secondary | ICD-10-CM | POA: Diagnosis not present

## 2018-06-23 DIAGNOSIS — I491 Atrial premature depolarization: Secondary | ICD-10-CM | POA: Diagnosis not present

## 2018-06-23 DIAGNOSIS — C50911 Malignant neoplasm of unspecified site of right female breast: Secondary | ICD-10-CM | POA: Diagnosis not present

## 2018-06-23 DIAGNOSIS — Z170 Estrogen receptor positive status [ER+]: Secondary | ICD-10-CM | POA: Diagnosis not present

## 2018-06-23 DIAGNOSIS — Z7982 Long term (current) use of aspirin: Secondary | ICD-10-CM | POA: Diagnosis not present

## 2018-06-23 DIAGNOSIS — Z79899 Other long term (current) drug therapy: Secondary | ICD-10-CM | POA: Diagnosis not present

## 2018-06-23 DIAGNOSIS — C50919 Malignant neoplasm of unspecified site of unspecified female breast: Secondary | ICD-10-CM | POA: Diagnosis not present

## 2018-06-23 HISTORY — PX: BREAST LUMPECTOMY WITH AXILLARY LYMPH NODE BIOPSY: SHX5593

## 2018-07-01 DIAGNOSIS — Z6825 Body mass index (BMI) 25.0-25.9, adult: Secondary | ICD-10-CM | POA: Diagnosis not present

## 2018-07-01 DIAGNOSIS — C50411 Malignant neoplasm of upper-outer quadrant of right female breast: Secondary | ICD-10-CM | POA: Diagnosis not present

## 2018-07-01 DIAGNOSIS — Z170 Estrogen receptor positive status [ER+]: Secondary | ICD-10-CM | POA: Diagnosis not present

## 2018-07-09 DIAGNOSIS — Z6825 Body mass index (BMI) 25.0-25.9, adult: Secondary | ICD-10-CM | POA: Diagnosis not present

## 2018-07-09 DIAGNOSIS — Z09 Encounter for follow-up examination after completed treatment for conditions other than malignant neoplasm: Secondary | ICD-10-CM | POA: Diagnosis not present

## 2018-07-09 DIAGNOSIS — C50411 Malignant neoplasm of upper-outer quadrant of right female breast: Secondary | ICD-10-CM | POA: Diagnosis not present

## 2018-07-10 DIAGNOSIS — Z79811 Long term (current) use of aromatase inhibitors: Secondary | ICD-10-CM | POA: Diagnosis not present

## 2018-07-10 DIAGNOSIS — C50411 Malignant neoplasm of upper-outer quadrant of right female breast: Secondary | ICD-10-CM | POA: Diagnosis not present

## 2018-07-10 DIAGNOSIS — Z170 Estrogen receptor positive status [ER+]: Secondary | ICD-10-CM | POA: Diagnosis not present

## 2018-07-14 DIAGNOSIS — M8580 Other specified disorders of bone density and structure, unspecified site: Secondary | ICD-10-CM | POA: Diagnosis not present

## 2018-07-14 DIAGNOSIS — Z23 Encounter for immunization: Secondary | ICD-10-CM | POA: Diagnosis not present

## 2018-07-14 DIAGNOSIS — Z13820 Encounter for screening for osteoporosis: Secondary | ICD-10-CM | POA: Diagnosis not present

## 2018-07-14 DIAGNOSIS — M859 Disorder of bone density and structure, unspecified: Secondary | ICD-10-CM | POA: Diagnosis not present

## 2018-07-18 DIAGNOSIS — C50411 Malignant neoplasm of upper-outer quadrant of right female breast: Secondary | ICD-10-CM | POA: Diagnosis not present

## 2018-07-18 DIAGNOSIS — Z170 Estrogen receptor positive status [ER+]: Secondary | ICD-10-CM | POA: Diagnosis not present

## 2018-08-01 DIAGNOSIS — C50411 Malignant neoplasm of upper-outer quadrant of right female breast: Secondary | ICD-10-CM | POA: Diagnosis not present

## 2018-08-01 DIAGNOSIS — Z170 Estrogen receptor positive status [ER+]: Secondary | ICD-10-CM | POA: Diagnosis not present

## 2018-08-01 DIAGNOSIS — Z6825 Body mass index (BMI) 25.0-25.9, adult: Secondary | ICD-10-CM | POA: Diagnosis not present

## 2018-08-20 DIAGNOSIS — M1711 Unilateral primary osteoarthritis, right knee: Secondary | ICD-10-CM | POA: Diagnosis not present

## 2018-12-01 DIAGNOSIS — H401132 Primary open-angle glaucoma, bilateral, moderate stage: Secondary | ICD-10-CM | POA: Diagnosis not present

## 2018-12-01 DIAGNOSIS — H5203 Hypermetropia, bilateral: Secondary | ICD-10-CM | POA: Diagnosis not present

## 2018-12-01 DIAGNOSIS — H2513 Age-related nuclear cataract, bilateral: Secondary | ICD-10-CM | POA: Diagnosis not present

## 2019-01-26 DIAGNOSIS — I10 Essential (primary) hypertension: Secondary | ICD-10-CM | POA: Diagnosis not present

## 2019-01-26 DIAGNOSIS — Z0000 Encounter for general adult medical examination without abnormal findings: Secondary | ICD-10-CM | POA: Diagnosis not present

## 2019-01-26 DIAGNOSIS — G4700 Insomnia, unspecified: Secondary | ICD-10-CM | POA: Diagnosis not present

## 2019-01-26 DIAGNOSIS — Z79899 Other long term (current) drug therapy: Secondary | ICD-10-CM | POA: Diagnosis not present

## 2019-01-26 DIAGNOSIS — E7800 Pure hypercholesterolemia, unspecified: Secondary | ICD-10-CM | POA: Diagnosis not present

## 2019-01-26 DIAGNOSIS — E785 Hyperlipidemia, unspecified: Secondary | ICD-10-CM | POA: Diagnosis not present

## 2019-01-26 DIAGNOSIS — Z01419 Encounter for gynecological examination (general) (routine) without abnormal findings: Secondary | ICD-10-CM | POA: Diagnosis not present

## 2019-02-25 DIAGNOSIS — M810 Age-related osteoporosis without current pathological fracture: Secondary | ICD-10-CM | POA: Diagnosis not present

## 2019-02-25 DIAGNOSIS — C50411 Malignant neoplasm of upper-outer quadrant of right female breast: Secondary | ICD-10-CM | POA: Diagnosis not present

## 2019-02-25 DIAGNOSIS — Z170 Estrogen receptor positive status [ER+]: Secondary | ICD-10-CM | POA: Diagnosis not present

## 2019-04-13 DIAGNOSIS — S46111A Strain of muscle, fascia and tendon of long head of biceps, right arm, initial encounter: Secondary | ICD-10-CM | POA: Diagnosis not present

## 2019-04-13 DIAGNOSIS — M19011 Primary osteoarthritis, right shoulder: Secondary | ICD-10-CM | POA: Diagnosis not present

## 2019-05-18 DIAGNOSIS — R921 Mammographic calcification found on diagnostic imaging of breast: Secondary | ICD-10-CM | POA: Diagnosis not present

## 2019-05-18 DIAGNOSIS — C50411 Malignant neoplasm of upper-outer quadrant of right female breast: Secondary | ICD-10-CM | POA: Diagnosis not present

## 2019-05-21 DIAGNOSIS — Z170 Estrogen receptor positive status [ER+]: Secondary | ICD-10-CM | POA: Diagnosis not present

## 2019-05-21 DIAGNOSIS — C50411 Malignant neoplasm of upper-outer quadrant of right female breast: Secondary | ICD-10-CM | POA: Diagnosis not present

## 2019-05-25 DIAGNOSIS — M25512 Pain in left shoulder: Secondary | ICD-10-CM | POA: Diagnosis not present

## 2019-05-25 DIAGNOSIS — G8929 Other chronic pain: Secondary | ICD-10-CM | POA: Diagnosis not present

## 2019-06-04 DIAGNOSIS — Z79811 Long term (current) use of aromatase inhibitors: Secondary | ICD-10-CM | POA: Diagnosis not present

## 2019-06-04 DIAGNOSIS — M810 Age-related osteoporosis without current pathological fracture: Secondary | ICD-10-CM | POA: Diagnosis not present

## 2019-06-04 DIAGNOSIS — Z853 Personal history of malignant neoplasm of breast: Secondary | ICD-10-CM | POA: Diagnosis not present

## 2019-06-15 DIAGNOSIS — D1801 Hemangioma of skin and subcutaneous tissue: Secondary | ICD-10-CM | POA: Diagnosis not present

## 2019-06-15 DIAGNOSIS — D485 Neoplasm of uncertain behavior of skin: Secondary | ICD-10-CM | POA: Diagnosis not present

## 2019-06-15 DIAGNOSIS — D225 Melanocytic nevi of trunk: Secondary | ICD-10-CM | POA: Diagnosis not present

## 2019-06-15 DIAGNOSIS — L578 Other skin changes due to chronic exposure to nonionizing radiation: Secondary | ICD-10-CM | POA: Diagnosis not present

## 2019-06-15 DIAGNOSIS — L820 Inflamed seborrheic keratosis: Secondary | ICD-10-CM | POA: Diagnosis not present

## 2019-06-29 DIAGNOSIS — H401133 Primary open-angle glaucoma, bilateral, severe stage: Secondary | ICD-10-CM | POA: Diagnosis not present

## 2019-06-29 DIAGNOSIS — H2513 Age-related nuclear cataract, bilateral: Secondary | ICD-10-CM | POA: Diagnosis not present

## 2019-07-06 DIAGNOSIS — G8929 Other chronic pain: Secondary | ICD-10-CM | POA: Diagnosis not present

## 2019-07-06 DIAGNOSIS — M25512 Pain in left shoulder: Secondary | ICD-10-CM | POA: Diagnosis not present

## 2019-07-06 DIAGNOSIS — M19011 Primary osteoarthritis, right shoulder: Secondary | ICD-10-CM | POA: Diagnosis not present

## 2019-07-24 DIAGNOSIS — Z8616 Personal history of COVID-19: Secondary | ICD-10-CM

## 2019-07-24 HISTORY — PX: CATARACT EXTRACTION W/ INTRAOCULAR LENS IMPLANT: SHX1309

## 2019-07-24 HISTORY — DX: Personal history of COVID-19: Z86.16

## 2019-07-28 DIAGNOSIS — Z20822 Contact with and (suspected) exposure to covid-19: Secondary | ICD-10-CM | POA: Diagnosis not present

## 2019-07-28 DIAGNOSIS — Z20828 Contact with and (suspected) exposure to other viral communicable diseases: Secondary | ICD-10-CM | POA: Diagnosis not present

## 2019-08-07 DIAGNOSIS — M25512 Pain in left shoulder: Secondary | ICD-10-CM | POA: Diagnosis not present

## 2019-08-07 DIAGNOSIS — S46012A Strain of muscle(s) and tendon(s) of the rotator cuff of left shoulder, initial encounter: Secondary | ICD-10-CM | POA: Diagnosis not present

## 2019-08-07 DIAGNOSIS — M75122 Complete rotator cuff tear or rupture of left shoulder, not specified as traumatic: Secondary | ICD-10-CM | POA: Diagnosis not present

## 2019-08-19 DIAGNOSIS — G8929 Other chronic pain: Secondary | ICD-10-CM | POA: Diagnosis not present

## 2019-08-19 DIAGNOSIS — M25512 Pain in left shoulder: Secondary | ICD-10-CM | POA: Diagnosis not present

## 2019-08-19 DIAGNOSIS — M75102 Unspecified rotator cuff tear or rupture of left shoulder, not specified as traumatic: Secondary | ICD-10-CM | POA: Diagnosis not present

## 2019-08-19 DIAGNOSIS — M19012 Primary osteoarthritis, left shoulder: Secondary | ICD-10-CM | POA: Diagnosis not present

## 2019-08-24 DIAGNOSIS — Z853 Personal history of malignant neoplasm of breast: Secondary | ICD-10-CM | POA: Diagnosis not present

## 2019-08-24 DIAGNOSIS — M810 Age-related osteoporosis without current pathological fracture: Secondary | ICD-10-CM | POA: Diagnosis not present

## 2019-08-24 DIAGNOSIS — Z79811 Long term (current) use of aromatase inhibitors: Secondary | ICD-10-CM | POA: Diagnosis not present

## 2019-08-24 DIAGNOSIS — C50411 Malignant neoplasm of upper-outer quadrant of right female breast: Secondary | ICD-10-CM | POA: Diagnosis not present

## 2019-08-31 DIAGNOSIS — Z1159 Encounter for screening for other viral diseases: Secondary | ICD-10-CM | POA: Diagnosis not present

## 2019-09-04 DIAGNOSIS — Z7982 Long term (current) use of aspirin: Secondary | ICD-10-CM | POA: Diagnosis not present

## 2019-09-04 DIAGNOSIS — I10 Essential (primary) hypertension: Secondary | ICD-10-CM | POA: Diagnosis not present

## 2019-09-04 DIAGNOSIS — H409 Unspecified glaucoma: Secondary | ICD-10-CM | POA: Diagnosis not present

## 2019-09-04 DIAGNOSIS — I454 Nonspecific intraventricular block: Secondary | ICD-10-CM | POA: Diagnosis not present

## 2019-09-04 DIAGNOSIS — M75102 Unspecified rotator cuff tear or rupture of left shoulder, not specified as traumatic: Secondary | ICD-10-CM | POA: Diagnosis not present

## 2019-09-04 DIAGNOSIS — G8918 Other acute postprocedural pain: Secondary | ICD-10-CM | POA: Diagnosis not present

## 2019-09-04 DIAGNOSIS — M7552 Bursitis of left shoulder: Secondary | ICD-10-CM | POA: Diagnosis not present

## 2019-09-04 DIAGNOSIS — M19012 Primary osteoarthritis, left shoulder: Secondary | ICD-10-CM | POA: Diagnosis not present

## 2019-09-04 DIAGNOSIS — M75112 Incomplete rotator cuff tear or rupture of left shoulder, not specified as traumatic: Secondary | ICD-10-CM | POA: Diagnosis not present

## 2019-09-04 DIAGNOSIS — M19011 Primary osteoarthritis, right shoulder: Secondary | ICD-10-CM | POA: Diagnosis not present

## 2019-09-04 DIAGNOSIS — J302 Other seasonal allergic rhinitis: Secondary | ICD-10-CM | POA: Diagnosis not present

## 2019-09-04 DIAGNOSIS — M25512 Pain in left shoulder: Secondary | ICD-10-CM | POA: Diagnosis not present

## 2019-10-15 DIAGNOSIS — M25512 Pain in left shoulder: Secondary | ICD-10-CM | POA: Diagnosis not present

## 2019-10-15 DIAGNOSIS — M75102 Unspecified rotator cuff tear or rupture of left shoulder, not specified as traumatic: Secondary | ICD-10-CM | POA: Diagnosis not present

## 2019-10-20 DIAGNOSIS — M25512 Pain in left shoulder: Secondary | ICD-10-CM | POA: Diagnosis not present

## 2019-10-20 DIAGNOSIS — M75102 Unspecified rotator cuff tear or rupture of left shoulder, not specified as traumatic: Secondary | ICD-10-CM | POA: Diagnosis not present

## 2019-10-22 DIAGNOSIS — M25512 Pain in left shoulder: Secondary | ICD-10-CM | POA: Diagnosis not present

## 2019-10-22 DIAGNOSIS — M75102 Unspecified rotator cuff tear or rupture of left shoulder, not specified as traumatic: Secondary | ICD-10-CM | POA: Diagnosis not present

## 2019-10-28 DIAGNOSIS — M25512 Pain in left shoulder: Secondary | ICD-10-CM | POA: Diagnosis not present

## 2019-10-28 DIAGNOSIS — M75102 Unspecified rotator cuff tear or rupture of left shoulder, not specified as traumatic: Secondary | ICD-10-CM | POA: Diagnosis not present

## 2019-11-04 DIAGNOSIS — M7022 Olecranon bursitis, left elbow: Secondary | ICD-10-CM | POA: Diagnosis not present

## 2019-11-05 DIAGNOSIS — M75102 Unspecified rotator cuff tear or rupture of left shoulder, not specified as traumatic: Secondary | ICD-10-CM | POA: Diagnosis not present

## 2019-11-05 DIAGNOSIS — M25512 Pain in left shoulder: Secondary | ICD-10-CM | POA: Diagnosis not present

## 2019-11-12 DIAGNOSIS — M75102 Unspecified rotator cuff tear or rupture of left shoulder, not specified as traumatic: Secondary | ICD-10-CM | POA: Diagnosis not present

## 2019-11-12 DIAGNOSIS — M25512 Pain in left shoulder: Secondary | ICD-10-CM | POA: Diagnosis not present

## 2019-11-18 DIAGNOSIS — Z853 Personal history of malignant neoplasm of breast: Secondary | ICD-10-CM | POA: Diagnosis not present

## 2019-11-18 DIAGNOSIS — Z170 Estrogen receptor positive status [ER+]: Secondary | ICD-10-CM | POA: Diagnosis not present

## 2019-11-18 DIAGNOSIS — R921 Mammographic calcification found on diagnostic imaging of breast: Secondary | ICD-10-CM | POA: Diagnosis not present

## 2019-11-24 DIAGNOSIS — C50411 Malignant neoplasm of upper-outer quadrant of right female breast: Secondary | ICD-10-CM | POA: Diagnosis not present

## 2019-11-24 DIAGNOSIS — Z170 Estrogen receptor positive status [ER+]: Secondary | ICD-10-CM | POA: Diagnosis not present

## 2019-11-26 DIAGNOSIS — M25512 Pain in left shoulder: Secondary | ICD-10-CM | POA: Diagnosis not present

## 2019-11-26 DIAGNOSIS — M75102 Unspecified rotator cuff tear or rupture of left shoulder, not specified as traumatic: Secondary | ICD-10-CM | POA: Diagnosis not present

## 2019-12-03 DIAGNOSIS — M25512 Pain in left shoulder: Secondary | ICD-10-CM | POA: Diagnosis not present

## 2019-12-03 DIAGNOSIS — M75102 Unspecified rotator cuff tear or rupture of left shoulder, not specified as traumatic: Secondary | ICD-10-CM | POA: Diagnosis not present

## 2019-12-09 DIAGNOSIS — M1711 Unilateral primary osteoarthritis, right knee: Secondary | ICD-10-CM | POA: Diagnosis not present

## 2019-12-10 DIAGNOSIS — Z79811 Long term (current) use of aromatase inhibitors: Secondary | ICD-10-CM | POA: Diagnosis not present

## 2019-12-10 DIAGNOSIS — Z853 Personal history of malignant neoplasm of breast: Secondary | ICD-10-CM | POA: Diagnosis not present

## 2019-12-10 DIAGNOSIS — R921 Mammographic calcification found on diagnostic imaging of breast: Secondary | ICD-10-CM | POA: Diagnosis not present

## 2019-12-10 DIAGNOSIS — M75102 Unspecified rotator cuff tear or rupture of left shoulder, not specified as traumatic: Secondary | ICD-10-CM | POA: Diagnosis not present

## 2019-12-10 DIAGNOSIS — M810 Age-related osteoporosis without current pathological fracture: Secondary | ICD-10-CM | POA: Diagnosis not present

## 2019-12-10 DIAGNOSIS — C50411 Malignant neoplasm of upper-outer quadrant of right female breast: Secondary | ICD-10-CM | POA: Diagnosis not present

## 2019-12-10 DIAGNOSIS — M25512 Pain in left shoulder: Secondary | ICD-10-CM | POA: Diagnosis not present

## 2019-12-10 DIAGNOSIS — Z170 Estrogen receptor positive status [ER+]: Secondary | ICD-10-CM | POA: Diagnosis not present

## 2020-01-20 DIAGNOSIS — H3561 Retinal hemorrhage, right eye: Secondary | ICD-10-CM | POA: Diagnosis not present

## 2020-01-20 DIAGNOSIS — H2513 Age-related nuclear cataract, bilateral: Secondary | ICD-10-CM | POA: Diagnosis not present

## 2020-01-20 DIAGNOSIS — H401133 Primary open-angle glaucoma, bilateral, severe stage: Secondary | ICD-10-CM | POA: Diagnosis not present

## 2020-01-20 DIAGNOSIS — H524 Presbyopia: Secondary | ICD-10-CM | POA: Diagnosis not present

## 2020-01-20 DIAGNOSIS — H34231 Retinal artery branch occlusion, right eye: Secondary | ICD-10-CM | POA: Diagnosis not present

## 2020-01-20 DIAGNOSIS — H43392 Other vitreous opacities, left eye: Secondary | ICD-10-CM | POA: Diagnosis not present

## 2020-01-29 DIAGNOSIS — I10 Essential (primary) hypertension: Secondary | ICD-10-CM | POA: Diagnosis not present

## 2020-01-29 DIAGNOSIS — H401132 Primary open-angle glaucoma, bilateral, moderate stage: Secondary | ICD-10-CM | POA: Diagnosis not present

## 2020-01-29 DIAGNOSIS — H2513 Age-related nuclear cataract, bilateral: Secondary | ICD-10-CM | POA: Diagnosis not present

## 2020-01-29 DIAGNOSIS — H2511 Age-related nuclear cataract, right eye: Secondary | ICD-10-CM | POA: Diagnosis not present

## 2020-02-18 DIAGNOSIS — H401122 Primary open-angle glaucoma, left eye, moderate stage: Secondary | ICD-10-CM | POA: Diagnosis not present

## 2020-02-18 DIAGNOSIS — H2512 Age-related nuclear cataract, left eye: Secondary | ICD-10-CM | POA: Diagnosis not present

## 2020-02-18 DIAGNOSIS — H401112 Primary open-angle glaucoma, right eye, moderate stage: Secondary | ICD-10-CM | POA: Diagnosis not present

## 2020-02-18 DIAGNOSIS — H2511 Age-related nuclear cataract, right eye: Secondary | ICD-10-CM | POA: Diagnosis not present

## 2020-02-19 DIAGNOSIS — H2512 Age-related nuclear cataract, left eye: Secondary | ICD-10-CM | POA: Diagnosis not present

## 2020-03-03 DIAGNOSIS — H401122 Primary open-angle glaucoma, left eye, moderate stage: Secondary | ICD-10-CM | POA: Diagnosis not present

## 2020-03-03 DIAGNOSIS — H2512 Age-related nuclear cataract, left eye: Secondary | ICD-10-CM | POA: Diagnosis not present

## 2020-03-10 DIAGNOSIS — Z01818 Encounter for other preprocedural examination: Secondary | ICD-10-CM | POA: Diagnosis not present

## 2020-03-10 DIAGNOSIS — R52 Pain, unspecified: Secondary | ICD-10-CM | POA: Diagnosis not present

## 2020-03-10 DIAGNOSIS — E559 Vitamin D deficiency, unspecified: Secondary | ICD-10-CM | POA: Diagnosis not present

## 2020-03-10 DIAGNOSIS — Z79899 Other long term (current) drug therapy: Secondary | ICD-10-CM | POA: Diagnosis not present

## 2020-03-10 DIAGNOSIS — M79609 Pain in unspecified limb: Secondary | ICD-10-CM | POA: Diagnosis not present

## 2020-03-10 DIAGNOSIS — M1711 Unilateral primary osteoarthritis, right knee: Secondary | ICD-10-CM | POA: Diagnosis not present

## 2020-03-16 DIAGNOSIS — Z79899 Other long term (current) drug therapy: Secondary | ICD-10-CM | POA: Diagnosis not present

## 2020-03-16 DIAGNOSIS — Z1331 Encounter for screening for depression: Secondary | ICD-10-CM | POA: Diagnosis not present

## 2020-03-16 DIAGNOSIS — Z0000 Encounter for general adult medical examination without abnormal findings: Secondary | ICD-10-CM | POA: Diagnosis not present

## 2020-03-16 DIAGNOSIS — I10 Essential (primary) hypertension: Secondary | ICD-10-CM | POA: Diagnosis not present

## 2020-03-16 DIAGNOSIS — C50911 Malignant neoplasm of unspecified site of right female breast: Secondary | ICD-10-CM | POA: Diagnosis not present

## 2020-03-16 DIAGNOSIS — G4700 Insomnia, unspecified: Secondary | ICD-10-CM | POA: Diagnosis not present

## 2020-03-16 DIAGNOSIS — Z6823 Body mass index (BMI) 23.0-23.9, adult: Secondary | ICD-10-CM | POA: Diagnosis not present

## 2020-03-23 HISTORY — PX: TOTAL KNEE ARTHROPLASTY: SHX125

## 2020-03-24 DIAGNOSIS — Z1159 Encounter for screening for other viral diseases: Secondary | ICD-10-CM | POA: Diagnosis not present

## 2020-03-24 DIAGNOSIS — Z1152 Encounter for screening for COVID-19: Secondary | ICD-10-CM | POA: Diagnosis not present

## 2020-03-29 DIAGNOSIS — Z79899 Other long term (current) drug therapy: Secondary | ICD-10-CM | POA: Diagnosis not present

## 2020-03-29 DIAGNOSIS — I10 Essential (primary) hypertension: Secondary | ICD-10-CM | POA: Diagnosis not present

## 2020-03-29 DIAGNOSIS — G8918 Other acute postprocedural pain: Secondary | ICD-10-CM | POA: Diagnosis not present

## 2020-03-29 DIAGNOSIS — M170 Bilateral primary osteoarthritis of knee: Secondary | ICD-10-CM | POA: Diagnosis not present

## 2020-03-29 DIAGNOSIS — Z853 Personal history of malignant neoplasm of breast: Secondary | ICD-10-CM | POA: Diagnosis not present

## 2020-03-29 DIAGNOSIS — Z96651 Presence of right artificial knee joint: Secondary | ICD-10-CM | POA: Diagnosis not present

## 2020-03-29 DIAGNOSIS — M21161 Varus deformity, not elsewhere classified, right knee: Secondary | ICD-10-CM | POA: Diagnosis not present

## 2020-03-29 DIAGNOSIS — M1711 Unilateral primary osteoarthritis, right knee: Secondary | ICD-10-CM | POA: Diagnosis not present

## 2020-03-29 DIAGNOSIS — Z471 Aftercare following joint replacement surgery: Secondary | ICD-10-CM | POA: Diagnosis not present

## 2020-03-29 DIAGNOSIS — M19012 Primary osteoarthritis, left shoulder: Secondary | ICD-10-CM | POA: Diagnosis not present

## 2020-03-29 DIAGNOSIS — Z7982 Long term (current) use of aspirin: Secondary | ICD-10-CM | POA: Diagnosis not present

## 2020-03-30 DIAGNOSIS — M19012 Primary osteoarthritis, left shoulder: Secondary | ICD-10-CM | POA: Diagnosis not present

## 2020-03-30 DIAGNOSIS — Z79899 Other long term (current) drug therapy: Secondary | ICD-10-CM | POA: Diagnosis not present

## 2020-03-30 DIAGNOSIS — Z853 Personal history of malignant neoplasm of breast: Secondary | ICD-10-CM | POA: Diagnosis not present

## 2020-03-30 DIAGNOSIS — Z7982 Long term (current) use of aspirin: Secondary | ICD-10-CM | POA: Diagnosis not present

## 2020-03-30 DIAGNOSIS — I10 Essential (primary) hypertension: Secondary | ICD-10-CM | POA: Diagnosis not present

## 2020-03-30 DIAGNOSIS — M170 Bilateral primary osteoarthritis of knee: Secondary | ICD-10-CM | POA: Diagnosis not present

## 2020-03-30 DIAGNOSIS — M21161 Varus deformity, not elsewhere classified, right knee: Secondary | ICD-10-CM | POA: Diagnosis not present

## 2020-03-31 DIAGNOSIS — M7022 Olecranon bursitis, left elbow: Secondary | ICD-10-CM | POA: Diagnosis not present

## 2020-03-31 DIAGNOSIS — C50411 Malignant neoplasm of upper-outer quadrant of right female breast: Secondary | ICD-10-CM | POA: Diagnosis not present

## 2020-03-31 DIAGNOSIS — M75102 Unspecified rotator cuff tear or rupture of left shoulder, not specified as traumatic: Secondary | ICD-10-CM | POA: Diagnosis not present

## 2020-03-31 DIAGNOSIS — Z471 Aftercare following joint replacement surgery: Secondary | ICD-10-CM | POA: Diagnosis not present

## 2020-03-31 DIAGNOSIS — M19012 Primary osteoarthritis, left shoulder: Secondary | ICD-10-CM | POA: Diagnosis not present

## 2020-03-31 DIAGNOSIS — H409 Unspecified glaucoma: Secondary | ICD-10-CM | POA: Diagnosis not present

## 2020-03-31 DIAGNOSIS — I10 Essential (primary) hypertension: Secondary | ICD-10-CM | POA: Diagnosis not present

## 2020-03-31 DIAGNOSIS — Z96651 Presence of right artificial knee joint: Secondary | ICD-10-CM | POA: Diagnosis not present

## 2020-03-31 DIAGNOSIS — Z170 Estrogen receptor positive status [ER+]: Secondary | ICD-10-CM | POA: Diagnosis not present

## 2020-04-01 DIAGNOSIS — Z170 Estrogen receptor positive status [ER+]: Secondary | ICD-10-CM | POA: Diagnosis not present

## 2020-04-01 DIAGNOSIS — Z471 Aftercare following joint replacement surgery: Secondary | ICD-10-CM | POA: Diagnosis not present

## 2020-04-01 DIAGNOSIS — C50411 Malignant neoplasm of upper-outer quadrant of right female breast: Secondary | ICD-10-CM | POA: Diagnosis not present

## 2020-04-01 DIAGNOSIS — M75102 Unspecified rotator cuff tear or rupture of left shoulder, not specified as traumatic: Secondary | ICD-10-CM | POA: Diagnosis not present

## 2020-04-01 DIAGNOSIS — M7022 Olecranon bursitis, left elbow: Secondary | ICD-10-CM | POA: Diagnosis not present

## 2020-04-01 DIAGNOSIS — H409 Unspecified glaucoma: Secondary | ICD-10-CM | POA: Diagnosis not present

## 2020-04-01 DIAGNOSIS — I10 Essential (primary) hypertension: Secondary | ICD-10-CM | POA: Diagnosis not present

## 2020-04-01 DIAGNOSIS — M19012 Primary osteoarthritis, left shoulder: Secondary | ICD-10-CM | POA: Diagnosis not present

## 2020-04-01 DIAGNOSIS — Z96651 Presence of right artificial knee joint: Secondary | ICD-10-CM | POA: Diagnosis not present

## 2020-04-04 DIAGNOSIS — Z170 Estrogen receptor positive status [ER+]: Secondary | ICD-10-CM | POA: Diagnosis not present

## 2020-04-04 DIAGNOSIS — M19012 Primary osteoarthritis, left shoulder: Secondary | ICD-10-CM | POA: Diagnosis not present

## 2020-04-04 DIAGNOSIS — H409 Unspecified glaucoma: Secondary | ICD-10-CM | POA: Diagnosis not present

## 2020-04-04 DIAGNOSIS — M7022 Olecranon bursitis, left elbow: Secondary | ICD-10-CM | POA: Diagnosis not present

## 2020-04-04 DIAGNOSIS — Z96651 Presence of right artificial knee joint: Secondary | ICD-10-CM | POA: Diagnosis not present

## 2020-04-04 DIAGNOSIS — I10 Essential (primary) hypertension: Secondary | ICD-10-CM | POA: Diagnosis not present

## 2020-04-04 DIAGNOSIS — C50411 Malignant neoplasm of upper-outer quadrant of right female breast: Secondary | ICD-10-CM | POA: Diagnosis not present

## 2020-04-04 DIAGNOSIS — M75102 Unspecified rotator cuff tear or rupture of left shoulder, not specified as traumatic: Secondary | ICD-10-CM | POA: Diagnosis not present

## 2020-04-04 DIAGNOSIS — Z471 Aftercare following joint replacement surgery: Secondary | ICD-10-CM | POA: Diagnosis not present

## 2020-04-06 DIAGNOSIS — C50411 Malignant neoplasm of upper-outer quadrant of right female breast: Secondary | ICD-10-CM | POA: Diagnosis not present

## 2020-04-06 DIAGNOSIS — M19012 Primary osteoarthritis, left shoulder: Secondary | ICD-10-CM | POA: Diagnosis not present

## 2020-04-06 DIAGNOSIS — Z170 Estrogen receptor positive status [ER+]: Secondary | ICD-10-CM | POA: Diagnosis not present

## 2020-04-06 DIAGNOSIS — M75102 Unspecified rotator cuff tear or rupture of left shoulder, not specified as traumatic: Secondary | ICD-10-CM | POA: Diagnosis not present

## 2020-04-06 DIAGNOSIS — Z471 Aftercare following joint replacement surgery: Secondary | ICD-10-CM | POA: Diagnosis not present

## 2020-04-06 DIAGNOSIS — I10 Essential (primary) hypertension: Secondary | ICD-10-CM | POA: Diagnosis not present

## 2020-04-06 DIAGNOSIS — M7022 Olecranon bursitis, left elbow: Secondary | ICD-10-CM | POA: Diagnosis not present

## 2020-04-06 DIAGNOSIS — H409 Unspecified glaucoma: Secondary | ICD-10-CM | POA: Diagnosis not present

## 2020-04-06 DIAGNOSIS — Z96651 Presence of right artificial knee joint: Secondary | ICD-10-CM | POA: Diagnosis not present

## 2020-04-08 DIAGNOSIS — M7022 Olecranon bursitis, left elbow: Secondary | ICD-10-CM | POA: Diagnosis not present

## 2020-04-08 DIAGNOSIS — H409 Unspecified glaucoma: Secondary | ICD-10-CM | POA: Diagnosis not present

## 2020-04-08 DIAGNOSIS — Z170 Estrogen receptor positive status [ER+]: Secondary | ICD-10-CM | POA: Diagnosis not present

## 2020-04-08 DIAGNOSIS — C50411 Malignant neoplasm of upper-outer quadrant of right female breast: Secondary | ICD-10-CM | POA: Diagnosis not present

## 2020-04-08 DIAGNOSIS — M19012 Primary osteoarthritis, left shoulder: Secondary | ICD-10-CM | POA: Diagnosis not present

## 2020-04-08 DIAGNOSIS — I10 Essential (primary) hypertension: Secondary | ICD-10-CM | POA: Diagnosis not present

## 2020-04-08 DIAGNOSIS — Z471 Aftercare following joint replacement surgery: Secondary | ICD-10-CM | POA: Diagnosis not present

## 2020-04-08 DIAGNOSIS — M75102 Unspecified rotator cuff tear or rupture of left shoulder, not specified as traumatic: Secondary | ICD-10-CM | POA: Diagnosis not present

## 2020-04-08 DIAGNOSIS — Z96651 Presence of right artificial knee joint: Secondary | ICD-10-CM | POA: Diagnosis not present

## 2020-04-11 DIAGNOSIS — Z471 Aftercare following joint replacement surgery: Secondary | ICD-10-CM | POA: Diagnosis not present

## 2020-04-11 DIAGNOSIS — C50411 Malignant neoplasm of upper-outer quadrant of right female breast: Secondary | ICD-10-CM | POA: Diagnosis not present

## 2020-04-11 DIAGNOSIS — M7022 Olecranon bursitis, left elbow: Secondary | ICD-10-CM | POA: Diagnosis not present

## 2020-04-11 DIAGNOSIS — Z170 Estrogen receptor positive status [ER+]: Secondary | ICD-10-CM | POA: Diagnosis not present

## 2020-04-11 DIAGNOSIS — I10 Essential (primary) hypertension: Secondary | ICD-10-CM | POA: Diagnosis not present

## 2020-04-11 DIAGNOSIS — H409 Unspecified glaucoma: Secondary | ICD-10-CM | POA: Diagnosis not present

## 2020-04-11 DIAGNOSIS — Z96651 Presence of right artificial knee joint: Secondary | ICD-10-CM | POA: Diagnosis not present

## 2020-04-11 DIAGNOSIS — M19012 Primary osteoarthritis, left shoulder: Secondary | ICD-10-CM | POA: Diagnosis not present

## 2020-04-11 DIAGNOSIS — M75102 Unspecified rotator cuff tear or rupture of left shoulder, not specified as traumatic: Secondary | ICD-10-CM | POA: Diagnosis not present

## 2020-04-12 DIAGNOSIS — M25561 Pain in right knee: Secondary | ICD-10-CM | POA: Diagnosis not present

## 2020-04-12 DIAGNOSIS — M1711 Unilateral primary osteoarthritis, right knee: Secondary | ICD-10-CM | POA: Diagnosis not present

## 2020-04-12 DIAGNOSIS — Z96651 Presence of right artificial knee joint: Secondary | ICD-10-CM | POA: Diagnosis not present

## 2020-04-19 DIAGNOSIS — M25561 Pain in right knee: Secondary | ICD-10-CM | POA: Diagnosis not present

## 2020-04-19 DIAGNOSIS — Z96651 Presence of right artificial knee joint: Secondary | ICD-10-CM | POA: Diagnosis not present

## 2020-04-19 DIAGNOSIS — M1711 Unilateral primary osteoarthritis, right knee: Secondary | ICD-10-CM | POA: Diagnosis not present

## 2020-04-21 DIAGNOSIS — E7849 Other hyperlipidemia: Secondary | ICD-10-CM | POA: Diagnosis not present

## 2020-04-21 DIAGNOSIS — M8580 Other specified disorders of bone density and structure, unspecified site: Secondary | ICD-10-CM | POA: Diagnosis not present

## 2020-04-21 DIAGNOSIS — I10 Essential (primary) hypertension: Secondary | ICD-10-CM | POA: Diagnosis not present

## 2020-04-22 DIAGNOSIS — M1711 Unilateral primary osteoarthritis, right knee: Secondary | ICD-10-CM | POA: Diagnosis not present

## 2020-04-22 DIAGNOSIS — M25561 Pain in right knee: Secondary | ICD-10-CM | POA: Diagnosis not present

## 2020-04-22 DIAGNOSIS — Z96651 Presence of right artificial knee joint: Secondary | ICD-10-CM | POA: Diagnosis not present

## 2020-04-26 DIAGNOSIS — M1711 Unilateral primary osteoarthritis, right knee: Secondary | ICD-10-CM | POA: Diagnosis not present

## 2020-04-26 DIAGNOSIS — M25561 Pain in right knee: Secondary | ICD-10-CM | POA: Diagnosis not present

## 2020-04-26 DIAGNOSIS — Z96651 Presence of right artificial knee joint: Secondary | ICD-10-CM | POA: Diagnosis not present

## 2020-04-29 DIAGNOSIS — M1711 Unilateral primary osteoarthritis, right knee: Secondary | ICD-10-CM | POA: Diagnosis not present

## 2020-04-29 DIAGNOSIS — Z96651 Presence of right artificial knee joint: Secondary | ICD-10-CM | POA: Diagnosis not present

## 2020-04-29 DIAGNOSIS — M25561 Pain in right knee: Secondary | ICD-10-CM | POA: Diagnosis not present

## 2020-05-03 DIAGNOSIS — M25561 Pain in right knee: Secondary | ICD-10-CM | POA: Diagnosis not present

## 2020-05-03 DIAGNOSIS — M1711 Unilateral primary osteoarthritis, right knee: Secondary | ICD-10-CM | POA: Diagnosis not present

## 2020-05-03 DIAGNOSIS — Z96651 Presence of right artificial knee joint: Secondary | ICD-10-CM | POA: Diagnosis not present

## 2020-05-03 DIAGNOSIS — H524 Presbyopia: Secondary | ICD-10-CM | POA: Diagnosis not present

## 2020-05-05 DIAGNOSIS — M1711 Unilateral primary osteoarthritis, right knee: Secondary | ICD-10-CM | POA: Diagnosis not present

## 2020-05-06 DIAGNOSIS — Z96651 Presence of right artificial knee joint: Secondary | ICD-10-CM | POA: Diagnosis not present

## 2020-05-06 DIAGNOSIS — M1711 Unilateral primary osteoarthritis, right knee: Secondary | ICD-10-CM | POA: Diagnosis not present

## 2020-05-06 DIAGNOSIS — M25561 Pain in right knee: Secondary | ICD-10-CM | POA: Diagnosis not present

## 2020-05-13 DIAGNOSIS — Z96651 Presence of right artificial knee joint: Secondary | ICD-10-CM | POA: Diagnosis not present

## 2020-05-13 DIAGNOSIS — M25561 Pain in right knee: Secondary | ICD-10-CM | POA: Diagnosis not present

## 2020-05-13 DIAGNOSIS — M1711 Unilateral primary osteoarthritis, right knee: Secondary | ICD-10-CM | POA: Diagnosis not present

## 2020-05-18 DIAGNOSIS — R921 Mammographic calcification found on diagnostic imaging of breast: Secondary | ICD-10-CM | POA: Diagnosis not present

## 2020-05-18 DIAGNOSIS — C50411 Malignant neoplasm of upper-outer quadrant of right female breast: Secondary | ICD-10-CM | POA: Diagnosis not present

## 2020-05-20 DIAGNOSIS — C50411 Malignant neoplasm of upper-outer quadrant of right female breast: Secondary | ICD-10-CM | POA: Diagnosis not present

## 2020-05-20 DIAGNOSIS — Z170 Estrogen receptor positive status [ER+]: Secondary | ICD-10-CM | POA: Diagnosis not present

## 2020-05-31 DIAGNOSIS — H401130 Primary open-angle glaucoma, bilateral, stage unspecified: Secondary | ICD-10-CM | POA: Diagnosis not present

## 2020-05-31 DIAGNOSIS — Z961 Presence of intraocular lens: Secondary | ICD-10-CM | POA: Diagnosis not present

## 2020-05-31 DIAGNOSIS — H26493 Other secondary cataract, bilateral: Secondary | ICD-10-CM | POA: Diagnosis not present

## 2020-06-01 ENCOUNTER — Other Ambulatory Visit: Payer: Self-pay | Admitting: Oncology

## 2020-08-11 DIAGNOSIS — N3090 Cystitis, unspecified without hematuria: Secondary | ICD-10-CM | POA: Diagnosis not present

## 2020-08-11 DIAGNOSIS — Z6824 Body mass index (BMI) 24.0-24.9, adult: Secondary | ICD-10-CM | POA: Diagnosis not present

## 2020-08-31 DIAGNOSIS — H401130 Primary open-angle glaucoma, bilateral, stage unspecified: Secondary | ICD-10-CM | POA: Diagnosis not present

## 2020-08-31 DIAGNOSIS — Z961 Presence of intraocular lens: Secondary | ICD-10-CM | POA: Diagnosis not present

## 2020-08-31 DIAGNOSIS — H26493 Other secondary cataract, bilateral: Secondary | ICD-10-CM | POA: Diagnosis not present

## 2020-09-02 ENCOUNTER — Other Ambulatory Visit: Payer: Self-pay | Admitting: Oncology

## 2020-09-02 ENCOUNTER — Encounter: Payer: Self-pay | Admitting: Oncology

## 2020-09-02 DIAGNOSIS — M810 Age-related osteoporosis without current pathological fracture: Secondary | ICD-10-CM | POA: Insufficient documentation

## 2020-09-09 DIAGNOSIS — L821 Other seborrheic keratosis: Secondary | ICD-10-CM | POA: Diagnosis not present

## 2020-09-09 DIAGNOSIS — L720 Epidermal cyst: Secondary | ICD-10-CM | POA: Diagnosis not present

## 2020-09-09 DIAGNOSIS — L814 Other melanin hyperpigmentation: Secondary | ICD-10-CM | POA: Diagnosis not present

## 2020-09-22 DIAGNOSIS — M1711 Unilateral primary osteoarthritis, right knee: Secondary | ICD-10-CM | POA: Diagnosis not present

## 2020-10-24 DIAGNOSIS — Z01818 Encounter for other preprocedural examination: Secondary | ICD-10-CM | POA: Diagnosis not present

## 2020-10-27 DIAGNOSIS — M19072 Primary osteoarthritis, left ankle and foot: Secondary | ICD-10-CM | POA: Diagnosis not present

## 2020-11-24 DIAGNOSIS — D126 Benign neoplasm of colon, unspecified: Secondary | ICD-10-CM | POA: Diagnosis not present

## 2020-11-24 DIAGNOSIS — Z853 Personal history of malignant neoplasm of breast: Secondary | ICD-10-CM | POA: Diagnosis not present

## 2020-11-24 DIAGNOSIS — K5730 Diverticulosis of large intestine without perforation or abscess without bleeding: Secondary | ICD-10-CM | POA: Diagnosis not present

## 2020-11-24 DIAGNOSIS — D124 Benign neoplasm of descending colon: Secondary | ICD-10-CM | POA: Diagnosis not present

## 2020-11-24 DIAGNOSIS — Z1211 Encounter for screening for malignant neoplasm of colon: Secondary | ICD-10-CM | POA: Diagnosis not present

## 2020-11-24 DIAGNOSIS — K635 Polyp of colon: Secondary | ICD-10-CM | POA: Diagnosis not present

## 2020-11-24 DIAGNOSIS — K644 Residual hemorrhoidal skin tags: Secondary | ICD-10-CM | POA: Diagnosis not present

## 2020-11-24 DIAGNOSIS — I10 Essential (primary) hypertension: Secondary | ICD-10-CM | POA: Diagnosis not present

## 2020-11-24 DIAGNOSIS — K648 Other hemorrhoids: Secondary | ICD-10-CM | POA: Diagnosis not present

## 2021-01-02 DIAGNOSIS — H401130 Primary open-angle glaucoma, bilateral, stage unspecified: Secondary | ICD-10-CM | POA: Diagnosis not present

## 2021-01-02 DIAGNOSIS — H26493 Other secondary cataract, bilateral: Secondary | ICD-10-CM | POA: Diagnosis not present

## 2021-01-02 DIAGNOSIS — Z961 Presence of intraocular lens: Secondary | ICD-10-CM | POA: Diagnosis not present

## 2021-03-24 DIAGNOSIS — Z1331 Encounter for screening for depression: Secondary | ICD-10-CM | POA: Diagnosis not present

## 2021-03-24 DIAGNOSIS — C50911 Malignant neoplasm of unspecified site of right female breast: Secondary | ICD-10-CM | POA: Diagnosis not present

## 2021-03-24 DIAGNOSIS — Z79899 Other long term (current) drug therapy: Secondary | ICD-10-CM | POA: Diagnosis not present

## 2021-03-24 DIAGNOSIS — Z6824 Body mass index (BMI) 24.0-24.9, adult: Secondary | ICD-10-CM | POA: Diagnosis not present

## 2021-03-24 DIAGNOSIS — I10 Essential (primary) hypertension: Secondary | ICD-10-CM | POA: Diagnosis not present

## 2021-03-24 DIAGNOSIS — Z0000 Encounter for general adult medical examination without abnormal findings: Secondary | ICD-10-CM | POA: Diagnosis not present

## 2021-03-29 DIAGNOSIS — M1711 Unilateral primary osteoarthritis, right knee: Secondary | ICD-10-CM | POA: Diagnosis not present

## 2021-04-25 DIAGNOSIS — M19072 Primary osteoarthritis, left ankle and foot: Secondary | ICD-10-CM | POA: Diagnosis not present

## 2021-05-09 ENCOUNTER — Ambulatory Visit (INDEPENDENT_AMBULATORY_CARE_PROVIDER_SITE_OTHER): Payer: Medicare HMO | Admitting: Sports Medicine

## 2021-05-09 ENCOUNTER — Encounter: Payer: Self-pay | Admitting: Sports Medicine

## 2021-05-09 ENCOUNTER — Ambulatory Visit (INDEPENDENT_AMBULATORY_CARE_PROVIDER_SITE_OTHER): Payer: Medicare HMO

## 2021-05-09 ENCOUNTER — Other Ambulatory Visit: Payer: Self-pay

## 2021-05-09 DIAGNOSIS — M722 Plantar fascial fibromatosis: Secondary | ICD-10-CM

## 2021-05-09 DIAGNOSIS — M2041 Other hammer toe(s) (acquired), right foot: Secondary | ICD-10-CM

## 2021-05-09 DIAGNOSIS — M21619 Bunion of unspecified foot: Secondary | ICD-10-CM

## 2021-05-09 DIAGNOSIS — M79671 Pain in right foot: Secondary | ICD-10-CM

## 2021-05-09 DIAGNOSIS — M19072 Primary osteoarthritis, left ankle and foot: Secondary | ICD-10-CM | POA: Diagnosis not present

## 2021-05-09 DIAGNOSIS — M79672 Pain in left foot: Secondary | ICD-10-CM | POA: Diagnosis not present

## 2021-05-09 NOTE — Progress Notes (Signed)
Subjective: Leslie Parsons is a 80 y.o. female patient who presents to office for evaluation of L>R foot pain.  Patient reports that she has had pain and discomfort for years was seen by orthopedics and they did an injection on her left foot for the arthritis that did not help and has also tried custom insoles that do not really help states that she also is concerned about her second and third toe sticking up and rubbing as well as the bunion that she has had for years sharp pain sometimes when walking states that the pain on her left foot is manageable but is also concerned or has questions on if there is anything else she can do for her right foot.  Patient is assisted by daughter this visit.  Patient Active Problem List   Diagnosis Date Noted   Osteoporosis 09/02/2020    Class: Chronic   Breast cancer of upper-outer quadrant of right female breast ([facility]) 06/18/2018    Class: Chronic   Syncope and collapse 05/31/2016   Atypical chest pain 05/31/2016   Left bundle branch block (LBBB) on electrocardiogram 06/01/2011    Current Outpatient Medications on File Prior to Visit  Medication Sig Dispense Refill   benazepril-hydrochlorthiazide (LOTENSIN HCT) 20-12.5 MG tablet Take 1 tablet by mouth daily.     No current facility-administered medications on file prior to visit.    Allergies  Allergen Reactions   Codeine Other (See Comments)    Doesn't tolerate    Objective:  General: Alert and oriented x3 in no acute distress  Dermatology: No open lesions bilateral lower extremities, no webspace macerations, no ecchymosis bilateral, all nails x 10 are well manicured. Soft corns to right 2-3rd toes.  Vascular: Dorsalis Pedis and Posterior Tibial pedal pulses  faintly palpable, Capillary Fill Time 5 seconds,scant pedal hair growth bilateral, no edema bilateral lower extremities, ++Varcosites bilateral.Temperature gradient within normal limits.  Neurology: Johney Maine sensation intact via light  touch bilateral.  Musculoskeletal: Diffuse pain to the dorsal midfoot on the left, semirigid bunion and hammertoe deformity noted on the right with most dislocation noted at the right second toe with plantar contracture of the right third toe.  Lesser MPJ and midfoot range of motion limited bilateral, Left greater than right.  Gait: Mildly antalgic gait  Xrays  Left and right foot   Impression: Diffuse decreased osseous mineralization consistent with osteopenia as determined by x-ray, there is significant joint space narrowing and dorsal spurring at the midfoot and TN joint there is lesser MPJ arthritis bilateral with dorsal dislocation on the right at the second toe and significant end-stage bunion deformity with hallux pronation noted on the right.  Assessment and Plan: Problem List Items Addressed This Visit   None Visit Diagnoses     Bilateral foot pain    -  Primary   Relevant Orders   DG Foot Complete Left   DG Foot Complete Right   Arthritis of foot, left       Bunion       Hammertoe of second toe of right foot            -Complete examination performed -Xrays reviewed -Discussed treatent options for bilateral foot pain advised patient due to her age and extensive deformity and arthritis she is not a surgical candidate -Recommend patient to continue with topical Voltaren for the left -Advised patient if pain reoccurs may try a another steroid injection however due to previous injection given by orthopedics not working it is  a likelihood that a second or third injection may not help due to the end-stage of arthritis -Advised good supportive shoes daily for foot type and orthotics for additional support -Dispensed hammertoe regulator for patient to use as directed at right second and third toes and advised patient that she is not a candidate for elective bunion or hammertoe surgery -Recommend shoes that provide support and do not rub toes -Encouraged patient to continue with  low impact exercises as tolerated -Provided patient with information on living with arthritis due to [email or web address] -Patient to return to office as needed or sooner if condition worsens.  Landis Martins, DPM

## 2021-05-09 NOTE — Patient Instructions (Signed)
[email or web address]

## 2021-05-22 ENCOUNTER — Other Ambulatory Visit: Payer: Self-pay | Admitting: Sports Medicine

## 2021-05-22 DIAGNOSIS — R922 Inconclusive mammogram: Secondary | ICD-10-CM | POA: Diagnosis not present

## 2021-05-22 DIAGNOSIS — C50411 Malignant neoplasm of upper-outer quadrant of right female breast: Secondary | ICD-10-CM | POA: Diagnosis not present

## 2021-05-22 DIAGNOSIS — Z170 Estrogen receptor positive status [ER+]: Secondary | ICD-10-CM | POA: Diagnosis not present

## 2021-05-22 DIAGNOSIS — M722 Plantar fascial fibromatosis: Secondary | ICD-10-CM

## 2021-05-22 DIAGNOSIS — Z853 Personal history of malignant neoplasm of breast: Secondary | ICD-10-CM | POA: Diagnosis not present

## 2021-05-26 DIAGNOSIS — C50411 Malignant neoplasm of upper-outer quadrant of right female breast: Secondary | ICD-10-CM | POA: Diagnosis not present

## 2021-05-26 DIAGNOSIS — Z170 Estrogen receptor positive status [ER+]: Secondary | ICD-10-CM | POA: Diagnosis not present

## 2021-05-29 ENCOUNTER — Other Ambulatory Visit: Payer: Self-pay

## 2021-05-29 ENCOUNTER — Ambulatory Visit (INDEPENDENT_AMBULATORY_CARE_PROVIDER_SITE_OTHER): Payer: Medicare HMO | Admitting: Podiatry

## 2021-05-29 DIAGNOSIS — M21619 Bunion of unspecified foot: Secondary | ICD-10-CM | POA: Diagnosis not present

## 2021-05-29 DIAGNOSIS — M2041 Other hammer toe(s) (acquired), right foot: Secondary | ICD-10-CM | POA: Diagnosis not present

## 2021-05-29 DIAGNOSIS — M79672 Pain in left foot: Secondary | ICD-10-CM | POA: Diagnosis not present

## 2021-05-29 DIAGNOSIS — M19072 Primary osteoarthritis, left ankle and foot: Secondary | ICD-10-CM | POA: Diagnosis not present

## 2021-05-29 DIAGNOSIS — M79671 Pain in right foot: Secondary | ICD-10-CM | POA: Diagnosis not present

## 2021-05-29 NOTE — Progress Notes (Signed)
  Subjective:  Patient ID: Leslie Parsons, female    DOB: [birth year],  MRN: [id number]  Chief Complaint  Patient presents with   Bunions    F/U bunion and H toe -pt states, no improvement, everything remainst he same -pt interested in tenotomy    80 y.o. female presents with the above complaint. History confirmed with patient. Was seen by Dr. Cannon Kettle but not deemed a good surgical candidate. Objective:  Physical Exam: warm, good capillary refill, no trophic changes or ulcerative lesions, normal DP and PT pulses, and normal sensory exam. Left Foot: hallux valgus with mild POP, 2nd/3rd toe hammertoes with POP 2nd PIPJ and 3rd distal aspect of the toe. 3rd toe longer than remaining digits. No callosities. No pain at the 2nd/3rd MPJ    No images are attached to the encounter.  Radiographs: X-ray of the right foot: Moderate/severe bunion deformity with 1st MPJ degenerative changes. Lateral deviation of remaining digits. Decreased bone stock. Assessment:   1. Arthritis of foot, left   2. Bilateral foot pain   3. Hammertoe of second toe of right foot   4. Bunion    Plan:  Patient was evaluated and treated and all questions answered.  Bunion and Hammertoe -Prior XR reviewed with patient -Educated on etiology of deformity -Discussed proper shoe gear modifications and padding -She has continued pain at the 2nd/3rd toes, less so the bunion, that is impeding her ability to be active. Unfortunately she would need concomitant 1st ray procedure to allow for straightened 2nd/3rd toes. -She is a very active 80 year old - going to the gym for 1 hour each AM. Minimal other risk factors, slight cardiac history LBBB but does not need to see a cardiologist regularly. I am not worried about her age from this perspective. Her bone does appear a little washed out on XR so I am worried about her bone stock. She is pending Dexa scan but this is in January. Will order Vit D to evaluate for  deficiency.  Return in about 3 weeks (around 06/19/2021) for Surgical planning visit.

## 2021-05-29 NOTE — Progress Notes (Signed)
[facility]  [address] [place],  [place]  [address] [phone]  Clinic Day:  06/05/2021  Referring physician: Ronita Hipps, MD  This document serves as a record of services personally performed by Hosie Poisson, MD. It was created on their behalf by [facility], [facility], [facility], a trained medical scribe. The creation of this record is based on the scribe's personal observations and the provider's statements to them.  ASSESSMENT & PLAN:   1. Stage IA hormone receptor positive right breast cancer diagnosed in October 2019.  She remains without evidence of recurrence.  She knows to continue anastrozole for least a total of 5 years, until December 2024.  2. Osteoporosis.  She was on alendronate 70 mg weekly but discontinued this. She does continue oral calcium supplement, and she will be starting high dose vitamin D.  She is scheduled for repeat bone density in January 2023.  She knows to continue anastrozole daily.  She is scheduled for bone density in January, and I will call her with the results. Since Dr. Lilia Pro sees her in the fall, we will plan to see her back in the spring for re-examination.  After that appointment we can go to annual follow up in order to alternate with him every 6 months. The patient understands the plans discussed today and is in agreement with them.  She knows to contact our office if she develops concerns regarding her breast cancer or its treatment.    I provided 15 minutes of face-to-face time during this this encounter and > 50% was spent counseling as documented under my assessment and plan.    Derwood Kaplan, MD [facility] [address] [place] [address] Dept: [phone] Dept Fax: [phone]   CHIEF COMPLAINT:  CC: Stage IA hormone receptor positive right breast cancer  Current Treatment:  Anastrozole 1 mg daily for 5 years   HISTORY OF  PRESENT ILLNESS:  Leslie Parsons is a 80 y.o. female with stage IA (T1c N0 M0) hormone receptor positive right breast cancer diagnosed in November 2019.  Screening mammogram in October 2019 revealed a possible mass on the right side.  Right diagnostic mammogram in November 2019 revealed a spiculated mass in the upper outer quadrant.  Ultrasound revealed an angulated hypoechoic mass at 11:30, which was 5 cm from the nipple and measured 1.9 cm.  There was a second lesion at 10 o'clock, measuring 0.6 cm, which was hypoechoic and slightly irregular.  These 2 masses were separated by 3.4 cm.  Right axillary ultrasound was negative.  Ultrasound-guided biopsy of the larger lesion at 11:30 revealed grade 2, invasive ductal carcinoma involving all cores, measuring up to 12 mm in length, as well as focal ductal carcinoma in situ.  Estrogen receptors were positive at 95% and progesterone receptors 35%.  HER 2 was negative.  Ki 67 was 15%.  Biopsy of the lesion at 10 o'clock revealed a fibroadenoma.  She was treated with lumpectomy and sentinel lymph node in December.  Final pathology revealed a 19 mm, grade 2, invasive ductal carcinoma with intermediate grade ductal carcinoma in situ.  One sentinel node was negative for metastasis.  Margins were clear .  The fibroadenoma was resected as well.  She developed a postoperative seroma in the right axilla, which which was drained and did not recur.  Bone density scan in December 2019 revealed osteoporosis with a T-score of -2.8 in the left  hip.  She was placed on anastrozole 1 mg daily for her breast cancer in December 2019.  Due to the osteoporosis, she was also placed on alendronate 70 mg weekly.  She had been taking calcium/vitamin-D daily and we increased this to twice daily.  Bilateral diagnostic mammogram in October 2020 revealed 3 mm group of calcifications at the lumpectomy site in the right breast felt to be likely benign, favoring dystrophic related to fat  necrosis.  There was evidence of malignancy in the left breast.    INTERVAL HISTORY:  I have reviewed her chart and materials related to her cancer extensively and collaborated history with the patient. Summary of oncologic history is as follows: Oncology History  Breast cancer of upper-outer quadrant of right female breast ([facility])  05/14/2018 Mammogram   SCREENING BILATERAL MAMMOGRAM: Further evaluation is suggested for possible mass in the right breast.   06/04/2018 Mammogram   DIAGNOSTIC RIGHT MAMMOGRAM AND RIGHT BREAST ULTRASOUND:  Targeted ultrasound is performed, showing angulated hypoechoic mass at the right breast 11:30 o'clock 5 cm from nipple measuring 1.8 x 1.4 x 1.9 cm correlating to the mass seen on mammogram. This correlates to the mass seen on mammogram. At the right breast 10 o'clock 2 cm from nipple, there is a 0.6 x 0.3 x 0.6 cm hypoechoic slight irregular border mass. The 2 masses are separated by 3.4 cm. Ultrasound of the right axilla is negative   06/10/2018 Cancer Staging   Staging form: Breast, AJCC 8th Edition - Clinical stage from 06/10/2018: Stage IA (cT1c, cN0(sn), cM0, G2, ER+, PR+, HER2-) - Signed by Derwood Kaplan, MD on 09/02/2020 Histopathologic type: Infiltrating duct carcinoma, NOS Stage prefix: Initial diagnosis Method of lymph node assessment: Sentinel lymph node biopsy Nuclear grade: G2 Multigene prognostic tests performed: None Menopausal status: Postmenopausal Ki-67 (%): 15 Stage used in treatment planning: Yes National guidelines used in treatment planning: Yes Type of national guideline used in treatment planning: NCCN    06/11/2018 Pathology Results   Breast, right, needle core biopsy, 11:30 o'clock, U/S guided:  -  Invasive ductal carcinoma, grade 2, involving all cores and measuring approximately 12 mm in maximal linear dimension  -  Possible focal ductal carcinoma in situ 2.    Breast, right, needle core biopsy, 10 o'clock, U/S  guided:  -  Fibroadenoma  -  No atypia, in situ or invasive malignancy identified HER2: Negative (1+) ER: Positive 95% PR: Positive 35% Ki67: 15%    06/18/2018 Initial Diagnosis   Breast cancer in female [facility])   06/23/2018 Pathology Results   Lymph node, sentinel, biopsy, (A) right axilla:  -  One lymph node negative for metastatic carcinoma out of a total of one submitted (0/1) 2.    Breast, lumpectomy, (B) right:  -  Invasive ductal carcinoma, grade 2, and intermediate grade ductal carcinoma in situ  -  Biopsy site identified  -  Fibroadenoma  -  Margins free of neoplasm    06/2018 -  Anti-estrogen oral therapy   Anastrozole 1 mg daily   05/22/2021 Mammogram   DIAGNOSTIC BILATERAL MAMMOGRAM: No evidence of malignancy within either breast. Stable postsurgical changes within the RIGHT breast.     Leslie is here for follow up and states that she has been well. She does have a hammer toe and will be undergoing surgery in the near future. She also states that she was told she has low normal vitamin D, and is going to be placed on high dose oral supplement.  She continues anastrozole daily without significant difficulty.  Annual bilateral mammogram from October was clear and she has followed up with Dr. Lilia Pro. She is scheduled for bone density in late January. Her  appetite is good, and she has gained 7 pounds since her last visit.  She denies fever, chills or other signs of infection.  She denies nausea, vomiting, bowel issues, or abdominal pain.  She denies sore throat, cough, dyspnea, or chest pain.  HISTORY:   Allergies:  Allergies  Allergen Reactions   Codeine Other (See Comments)    Doesn't tolerate    Current Medications: Current Outpatient Medications  Medication Sig Dispense Refill   anastrozole (ARIMIDEX) 1 MG tablet Take 1 mg by mouth daily.     aspirin 325 MG tablet Take 325 mg by mouth daily.     benazepril-hydrochlorthiazide (LOTENSIN HCT) 20-12.5 MG tablet  Take 1 tablet by mouth daily.     No current facility-administered medications for this visit.    REVIEW OF SYSTEMS:  Review of Systems  Constitutional: Negative.  Negative for appetite change, chills, fatigue, fever and unexpected weight change.  HENT:  Negative.    Eyes: Negative.   Respiratory: Negative.  Negative for chest tightness, cough, hemoptysis, shortness of breath and wheezing.   Cardiovascular: Negative.  Negative for chest pain, leg swelling and palpitations.  Gastrointestinal: Negative.  Negative for abdominal distention, abdominal pain, blood in stool, constipation, diarrhea, nausea and vomiting.  Endocrine: Negative.   Genitourinary: Negative.  Negative for difficulty urinating, dysuria, frequency and hematuria.   Musculoskeletal: Negative.  Negative for arthralgias, back pain, flank pain, gait problem and myalgias.  Skin: Negative.   Neurological: Negative.  Negative for dizziness, extremity weakness, gait problem, headaches, light-headedness, numbness, seizures and speech difficulty.  Hematological: Negative.   Psychiatric/Behavioral: Negative.  Negative for depression and sleep disturbance. The patient is not nervous/anxious.     VITALS:  Blood pressure (!) 143/72, pulse 67, temperature 98.6 F (37 C), temperature source Oral, resp. rate 18, height '5\' 5"'  (1.651 m), weight 145 lb 6.4 oz (66 kg), SpO2 98 %.  Wt Readings from Last 3 Encounters:  06/05/21 145 lb 6.4 oz (66 kg)  07/11/16 157 lb 6.4 oz (71.4 kg)  06/28/16 154 lb (69.9 kg)    Body mass index is 24.2 kg/m.  Performance status (ECOG): 0 - Asymptomatic  PHYSICAL EXAM:  Physical Exam Constitutional:      General: She is not in acute distress.    Appearance: Normal appearance. She is normal weight.  HENT:     Head: Normocephalic and atraumatic.  Eyes:     General: No scleral icterus.    Extraocular Movements: Extraocular movements intact.     Conjunctiva/sclera: Conjunctivae normal.     Pupils:  Pupils are equal, round, and reactive to light.  Cardiovascular:     Rate and Rhythm: Normal rate and regular rhythm.     Pulses: Normal pulses.     Heart sounds: Normal heart sounds. No murmur heard.   No friction rub. No gallop.  Pulmonary:     Effort: Pulmonary effort is normal. No respiratory distress.     Breath sounds: Normal breath sounds.  Chest:     Comments: Bilateral breasts are without masses. Abdominal:     General: Bowel sounds are normal. There is no distension.     Palpations: Abdomen is soft. There is no hepatomegaly, splenomegaly or mass.     Tenderness: There is no abdominal tenderness.  Musculoskeletal:  General: Normal range of motion.     Cervical back: Normal range of motion and neck supple.     Right lower leg: No edema.     Left lower leg: No edema.  Lymphadenopathy:     Cervical: No cervical adenopathy.  Skin:    General: Skin is warm and dry.  Neurological:     General: No focal deficit present.     Mental Status: She is alert and oriented to person, place, and time. Mental status is at baseline.  Psychiatric:        Mood and Affect: Mood normal.        Behavior: Behavior normal.        Thought Content: Thought content normal.        Judgment: Judgment normal.     LABS:  No flowsheet data found. No flowsheet data found.   STUDIES:   EXAM: 05/22/2021 DIGITAL DIAGNOSTIC BILATERAL MAMMOGRAM WITH TOMOSYNTHESIS AND CAD  TECHNIQUE: Bilateral digital diagnostic mammography and breast tomosynthesis was performed. The images were evaluated with computer-aided detection.  COMPARISON: Previous exam(s).  ACR Breast Density Category c: The breast tissue is heterogeneously dense, which may obscure small masses.  FINDINGS: There are stable postsurgical changes within the RIGHT breast. There are no new dominant masses, suspicious calcifications or secondary signs of malignancy within either breast.  IMPRESSION: No evidence of malignancy  within either breast. Stable postsurgical changes within the RIGHT breast.

## 2021-05-30 LAB — VITAMIN D 25 HYDROXY (VIT D DEFICIENCY, FRACTURES): Vit D, 25-Hydroxy: 29.2 ng/mL — ABNORMAL LOW (ref 30.0–100.0)

## 2021-06-01 ENCOUNTER — Ambulatory Visit: Payer: Medicare HMO | Admitting: Oncology

## 2021-06-05 ENCOUNTER — Encounter: Payer: Self-pay | Admitting: Oncology

## 2021-06-05 ENCOUNTER — Inpatient Hospital Stay: Payer: Medicare HMO | Attending: Oncology | Admitting: Oncology

## 2021-06-05 ENCOUNTER — Other Ambulatory Visit: Payer: Self-pay

## 2021-06-05 VITALS — BP 143/72 | HR 67 | Temp 98.6°F | Resp 18 | Ht 65.0 in | Wt 145.4 lb

## 2021-06-05 DIAGNOSIS — M810 Age-related osteoporosis without current pathological fracture: Secondary | ICD-10-CM

## 2021-06-05 DIAGNOSIS — C50411 Malignant neoplasm of upper-outer quadrant of right female breast: Secondary | ICD-10-CM | POA: Diagnosis not present

## 2021-06-05 DIAGNOSIS — Z170 Estrogen receptor positive status [ER+]: Secondary | ICD-10-CM | POA: Diagnosis not present

## 2021-06-12 ENCOUNTER — Telehealth: Payer: Self-pay | Admitting: Oncology

## 2021-06-12 NOTE — Telephone Encounter (Signed)
Per 11/14 LOS, patient scheduled for May 2023 Appt's.  Patient notified - Mailed Appt's

## 2021-06-22 ENCOUNTER — Ambulatory Visit (INDEPENDENT_AMBULATORY_CARE_PROVIDER_SITE_OTHER): Payer: Medicare HMO | Admitting: Podiatry

## 2021-06-22 ENCOUNTER — Encounter: Payer: Self-pay | Admitting: Podiatry

## 2021-06-22 DIAGNOSIS — M21619 Bunion of unspecified foot: Secondary | ICD-10-CM

## 2021-06-22 DIAGNOSIS — M2041 Other hammer toe(s) (acquired), right foot: Secondary | ICD-10-CM

## 2021-06-22 DIAGNOSIS — M19072 Primary osteoarthritis, left ankle and foot: Secondary | ICD-10-CM | POA: Diagnosis not present

## 2021-06-22 DIAGNOSIS — M79671 Pain in right foot: Secondary | ICD-10-CM | POA: Diagnosis not present

## 2021-06-22 DIAGNOSIS — M79672 Pain in left foot: Secondary | ICD-10-CM | POA: Diagnosis not present

## 2021-06-27 NOTE — Progress Notes (Signed)
  Subjective:  Patient ID: Leslie Parsons, female    DOB: 09/24/1940,  MRN: [id number]  Chief Complaint  Patient presents with   Foot Pain    I think I am to have surgery on the right foot with the right bunion and 2nd and 3rd toe right and the left foot still has some arthritis    80 y.o. female presents with the above complaint. History confirmed with patient.  Objective:  Physical Exam: warm, good capillary refill, no trophic changes or ulcerative lesions, normal DP and PT pulses, and normal sensory exam. Left Foot: hallux valgus with mild POP, 2nd/3rd toe hammertoes with POP 2nd PIPJ and 3rd distal aspect of the toe. 3rd toe longer than remaining digits. No callosities. No pain at the 2nd/3rd MPJ    No images are attached to the encounter.  Radiographs: 11/7/22X-ray of the right foot: Moderate/severe bunion deformity with 1st MPJ degenerative changes. Lateral deviation of remaining digits. Decreased bone stock. Assessment:   1. Arthritis of foot, left   2. Bilateral foot pain   3. Hammertoe of second toe of right foot   4. Bunion     Plan:  Patient was evaluated and treated and all questions answered.  Bunion and Hammertoe -Again reviewed x-rays with patient.  Discussed surgical intervention and surgical plan.  Extensive discussion about postoperative course.  She does have a little bit of decreased bone stock her vitamin D is a little low we will supplement postoperatively.  We discussed the risk of surgery and that would be any previous with her age however I find given her activity level her age should not preclude surgical intervention.  I think she has good potential for recovery -Patient has failed all conservative therapy and wishes to proceed with surgical intervention. All risks, benefits, and alternatives discussed with patient. No guarantees given. Consent reviewed and signed by patient. -Planned procedures: Correction of hammerteos 2nd/3rd toes with pin  fixation, correction of bunion with osteotomy or joint replacemetn -ASA 3 - Patient with moderate systemic disease with functional limitations   No follow-ups on file.

## 2021-07-10 DIAGNOSIS — H401130 Primary open-angle glaucoma, bilateral, stage unspecified: Secondary | ICD-10-CM | POA: Diagnosis not present

## 2021-07-10 DIAGNOSIS — H26493 Other secondary cataract, bilateral: Secondary | ICD-10-CM | POA: Diagnosis not present

## 2021-07-10 DIAGNOSIS — H524 Presbyopia: Secondary | ICD-10-CM | POA: Diagnosis not present

## 2021-07-10 DIAGNOSIS — Z961 Presence of intraocular lens: Secondary | ICD-10-CM | POA: Diagnosis not present

## 2021-07-25 ENCOUNTER — Telehealth: Payer: Self-pay | Admitting: Urology

## 2021-07-25 NOTE — Telephone Encounter (Signed)
DOS - 08/16/21   [place] BUNIONECTOMY RIGHT --- [id number] IMPLANT --- [id number] HAMMERTOE REPAIR 2,3 RIGHT --- [id number]   [employer] EFFECTIVE DATE - 07/23/17 [employer] EFFECTIVE DATE - 07/23/20   SPOKE WITH LESLIE WITH [facility] AND SHE STATED THAT CPT CODES 37342, [phone] AND [id number] HAS BEEN APPROVED, AUTH #[id number], GOOD FROM 08/16/21 - 11/14/21.  TRACKING # [id number] TRACKING # [id number]

## 2021-08-10 ENCOUNTER — Encounter (HOSPITAL_BASED_OUTPATIENT_CLINIC_OR_DEPARTMENT_OTHER): Payer: Self-pay | Admitting: Podiatry

## 2021-08-11 ENCOUNTER — Encounter (HOSPITAL_BASED_OUTPATIENT_CLINIC_OR_DEPARTMENT_OTHER): Payer: Self-pay | Admitting: Podiatry

## 2021-08-11 ENCOUNTER — Other Ambulatory Visit: Payer: Self-pay

## 2021-08-11 NOTE — Progress Notes (Signed)
Spoke w/ via phone for pre-op interview--- pt Lab needs dos----  [employer] and ekg             Lab results------ no COVID test -----patient states asymptomatic no test needed Arrive at ------- 0530 on 08-16-2021 NPO after MN NO Solid Food.  Clear liquids from MN until--- 0430 Med rec completed Medications to take morning of surgery ----- none Diabetic medication ----- n/a Patient instructed no nail polish to be worn day of surgery Patient instructed to bring photo id and insurance card day of surgery Patient aware to have Driver (ride ) / caregiver for 24 hours after surgery -- daughter, Lennox Grumbles Patient Special Instructions ----- n/a Pre-Op special Istructions ----- received pt's pcp, Dr L. Helene Kelp , H&P dated 08-04-2021 via fax from Dr March Rummage office , placed in chart. Sent inbox message to dr price in epic, requested orders. Patient verbalized understanding of instructions that were given at this phone interview. Patient denies shortness of breath, chest pain, fever, cough at this phone interview.

## 2021-08-15 NOTE — Anesthesia Preprocedure Evaluation (Addendum)
Anesthesia Evaluation  Patient identified by MRN, date of birth, ID band Patient awake    Reviewed: Allergy & Precautions, NPO status , Patient's Chart, lab work & pertinent test results  History of Anesthesia Complications Negative for: history of anesthetic complications  Airway Mallampati: I  TM Distance: >3 FB Neck ROM: Full    Dental  (+) Caps, Dental Advisory Given   Pulmonary neg pulmonary ROS,    breath sounds clear to auscultation       Cardiovascular hypertension, Pt. on medications  Rhythm:Regular Rate:Normal  '17 Stress: EF 54%, normal perfusion, no ischemia  '17 ECHO: EF 40-45%, mild-mod reduced LVF, normal RVF, no significant valvular abnormalities   Neuro/Psych Anxiety glaucoma    GI/Hepatic negative GI ROS, Neg liver ROS,   Endo/Other  negative endocrine ROS  Renal/GU negative Renal ROS     Musculoskeletal   Abdominal   Peds  Hematology negative hematology ROS (+)   Anesthesia Other Findings Breast cancer  Reproductive/Obstetrics                            Anesthesia Physical Anesthesia Plan  ASA: 3  Anesthesia Plan: MAC   Post-op Pain Management: Tylenol PO (pre-op)   Induction:   PONV Risk Score and Plan: 2 and Ondansetron and Treatment may vary due to age or medical condition  Airway Management Planned: Natural Airway and Simple Face Mask  Additional Equipment: None  Intra-op Plan:   Post-operative Plan:   Informed Consent: I have reviewed the patients History and Physical, chart, labs and discussed the procedure including the risks, benefits and alternatives for the proposed anesthesia with the patient or authorized representative who has indicated his/her understanding and acceptance.     Dental advisory given  Plan Discussed with: CRNA and Surgeon  Anesthesia Plan Comments: (Plan routine monitors, MAC with block by podiatrist)        Anesthesia Quick Evaluation

## 2021-08-16 ENCOUNTER — Ambulatory Visit (HOSPITAL_BASED_OUTPATIENT_CLINIC_OR_DEPARTMENT_OTHER): Payer: Medicare HMO | Admitting: Anesthesiology

## 2021-08-16 ENCOUNTER — Other Ambulatory Visit: Payer: Self-pay

## 2021-08-16 ENCOUNTER — Encounter (HOSPITAL_BASED_OUTPATIENT_CLINIC_OR_DEPARTMENT_OTHER): Payer: Self-pay | Admitting: Podiatry

## 2021-08-16 ENCOUNTER — Ambulatory Visit (HOSPITAL_BASED_OUTPATIENT_CLINIC_OR_DEPARTMENT_OTHER): Payer: Medicare HMO

## 2021-08-16 ENCOUNTER — Encounter (HOSPITAL_BASED_OUTPATIENT_CLINIC_OR_DEPARTMENT_OTHER)
Admission: RE | Disposition: A | Discharge status: Home / Self Care | Payer: Self-pay | Source: Home / Self Care | Attending: Podiatry

## 2021-08-16 ENCOUNTER — Ambulatory Visit (HOSPITAL_BASED_OUTPATIENT_CLINIC_OR_DEPARTMENT_OTHER)
Admission: RE | Admit: 2021-08-16 | Discharge: 2021-08-16 | Disposition: A | Discharge status: Home / Self Care | Payer: Medicare HMO | Attending: Podiatry | Admitting: Podiatry

## 2021-08-16 DIAGNOSIS — M2041 Other hammer toe(s) (acquired), right foot: Secondary | ICD-10-CM | POA: Insufficient documentation

## 2021-08-16 DIAGNOSIS — I10 Essential (primary) hypertension: Secondary | ICD-10-CM | POA: Diagnosis not present

## 2021-08-16 DIAGNOSIS — M2011 Hallux valgus (acquired), right foot: Secondary | ICD-10-CM | POA: Insufficient documentation

## 2021-08-16 DIAGNOSIS — M25774 Osteophyte, right foot: Secondary | ICD-10-CM | POA: Diagnosis not present

## 2021-08-16 DIAGNOSIS — F419 Anxiety disorder, unspecified: Secondary | ICD-10-CM | POA: Diagnosis not present

## 2021-08-16 DIAGNOSIS — M21611 Bunion of right foot: Secondary | ICD-10-CM | POA: Diagnosis not present

## 2021-08-16 HISTORY — DX: Unspecified glaucoma: H40.9

## 2021-08-16 HISTORY — PX: KENALOG INJECTION: SHX5298

## 2021-08-16 HISTORY — DX: Age-related osteoporosis without current pathological fracture: M81.0

## 2021-08-16 HISTORY — DX: Presence of spectacles and contact lenses: Z97.3

## 2021-08-16 HISTORY — DX: Left bundle-branch block, unspecified: I44.7

## 2021-08-16 HISTORY — PX: BUNIONECTOMY: SHX129

## 2021-08-16 HISTORY — DX: Unspecified osteoarthritis, unspecified site: M19.90

## 2021-08-16 HISTORY — PX: HAMMER TOE SURGERY: SHX385

## 2021-08-16 LAB — POCT I-STAT, CHEM 8
BUN: 14 mg/dL (ref 8–23)
Calcium, Ion: 1.28 mmol/L (ref 1.15–1.40)
Chloride: 101 mmol/L (ref 98–111)
Creatinine, Ser: 0.8 mg/dL (ref 0.44–1.00)
Glucose, Bld: 100 mg/dL — ABNORMAL HIGH (ref 70–99)
HCT: 39 % (ref 36.0–46.0)
Hemoglobin: 13.3 g/dL (ref 12.0–15.0)
Potassium: 3.7 mmol/L (ref 3.5–5.1)
Sodium: 138 mmol/L (ref 135–145)
TCO2: 27 mmol/L (ref 22–32)

## 2021-08-16 SURGERY — CORRECTION, HAMMER TOE
Anesthesia: Monitor Anesthesia Care | Site: Toe | Laterality: Right

## 2021-08-16 MED ORDER — CEFAZOLIN SODIUM-DEXTROSE 2-4 GM/100ML-% IV SOLN
2.0000 g | INTRAVENOUS | Status: AC
  Administered 2021-08-16: 07:00:00 2 g via INTRAVENOUS

## 2021-08-16 MED ORDER — PROPOFOL 10 MG/ML IV BOLUS
INTRAVENOUS | Status: DC | PRN
  Administered 2021-08-16: 40 mg via INTRAVENOUS

## 2021-08-16 MED ORDER — LIDOCAINE 2% (20 MG/ML) 5 ML SYRINGE
INTRAMUSCULAR | Status: DC | PRN
  Administered 2021-08-16: 60 mg via INTRAVENOUS

## 2021-08-16 MED ORDER — ACETAMINOPHEN 500 MG PO TABS
1000.0000 mg | ORAL_TABLET | Freq: Once | ORAL | Status: AC
  Administered 2021-08-16: 06:00:00 1000 mg via ORAL

## 2021-08-16 MED ORDER — EPHEDRINE 5 MG/ML INJ
INTRAVENOUS | Status: AC
  Filled 2021-08-16: qty 5

## 2021-08-16 MED ORDER — ONDANSETRON HCL 4 MG PO TABS: 4.0000 mg | ORAL_TABLET | Freq: Three times a day (TID) | ORAL | 0 refills | Status: DC | PRN

## 2021-08-16 MED ORDER — METHYLPREDNISOLONE ACETATE 40 MG/ML IJ SUSP
INTRAMUSCULAR | Status: DC | PRN
  Administered 2021-08-16: 40 mg

## 2021-08-16 MED ORDER — PROPOFOL 10 MG/ML IV BOLUS
INTRAVENOUS | Status: AC
  Filled 2021-08-16: qty 20

## 2021-08-16 MED ORDER — LACTATED RINGERS IV SOLN: INTRAVENOUS | Status: DC

## 2021-08-16 MED ORDER — FENTANYL CITRATE (PF) 100 MCG/2ML IJ SOLN
INTRAMUSCULAR | Status: DC | PRN
  Administered 2021-08-16 (×2): 25 ug via INTRAVENOUS

## 2021-08-16 MED ORDER — PROPOFOL 500 MG/50ML IV EMUL
INTRAVENOUS | Status: AC
  Filled 2021-08-16: qty 50

## 2021-08-16 MED ORDER — BUPIVACAINE LIPOSOME 1.3 % IJ SUSP
INTRAMUSCULAR | Status: DC | PRN
  Administered 2021-08-16: 10 mL

## 2021-08-16 MED ORDER — OXYCODONE-ACETAMINOPHEN 5-325 MG PO TABS
1.0000 | ORAL_TABLET | ORAL | 0 refills | Status: DC | PRN
Start: 2021-08-16 — End: 2023-12-06

## 2021-08-16 MED ORDER — CEPHALEXIN 500 MG PO CAPS: 500.0000 mg | ORAL_CAPSULE | Freq: Two times a day (BID) | ORAL | 0 refills | Status: AC

## 2021-08-16 MED ORDER — FENTANYL CITRATE (PF) 100 MCG/2ML IJ SOLN
INTRAMUSCULAR | Status: AC
  Filled 2021-08-16: qty 2

## 2021-08-16 MED ORDER — LIDOCAINE HCL (PF) 2 % IJ SOLN
INTRAMUSCULAR | Status: AC
  Filled 2021-08-16: qty 5

## 2021-08-16 MED ORDER — BUPIVACAINE HCL (PF) 0.5 % IJ SOLN
INTRAMUSCULAR | Status: DC | PRN
  Administered 2021-08-16: 23 mL

## 2021-08-16 MED ORDER — EPHEDRINE SULFATE-NACL 50-0.9 MG/10ML-% IV SOSY
PREFILLED_SYRINGE | INTRAVENOUS | Status: DC | PRN
  Administered 2021-08-16 (×3): 5 mg via INTRAVENOUS

## 2021-08-16 MED ORDER — BUPIVACAINE HCL 0.5 % IJ SOLN
INTRAMUSCULAR | Status: DC | PRN
  Administered 2021-08-16: 1 mL

## 2021-08-16 MED ORDER — PROPOFOL 500 MG/50ML IV EMUL
INTRAVENOUS | Status: DC | PRN
  Administered 2021-08-16: 100 ug/kg/min via INTRAVENOUS

## 2021-08-16 MED ORDER — 0.9 % SODIUM CHLORIDE (POUR BTL) OPTIME
TOPICAL | Status: DC | PRN
Start: 2021-08-16 — End: 2021-08-16
  Administered 2021-08-16: 08:00:00 500 mL

## 2021-08-16 MED ORDER — ACETAMINOPHEN 500 MG PO TABS
ORAL_TABLET | ORAL | Status: AC
  Filled 2021-08-16: qty 2

## 2021-08-16 MED ORDER — FENTANYL CITRATE (PF) 100 MCG/2ML IJ SOLN: 25.0000 ug | INTRAMUSCULAR | Status: DC | PRN

## 2021-08-16 MED ORDER — CEFAZOLIN SODIUM-DEXTROSE 2-4 GM/100ML-% IV SOLN
INTRAVENOUS | Status: AC
  Filled 2021-08-16: qty 100

## 2021-08-16 SURGICAL SUPPLY — 67 items
APL PRP STRL LF DISP 70% ISPRP (MISCELLANEOUS) ×2
BLADE AVERAGE 25X9 (BLADE) ×1 IMPLANT
BLADE MINI RND TIP GREEN BEAV (BLADE) ×1 IMPLANT
BLADE OSC/SAG .038X5.5 CUT EDG (BLADE) ×1 IMPLANT
BLADE OSCILLATING/SAGITTAL (BLADE) ×3
BLADE SURG 15 STRL LF DISP TIS (BLADE) ×2 IMPLANT
BLADE SURG 15 STRL SS (BLADE) ×3
BLADE SW THK.38XMED LNG THN (BLADE) ×2 IMPLANT
BNDG CMPR 9X4 STRL LF SNTH (GAUZE/BANDAGES/DRESSINGS) ×2
BNDG ELASTIC 3X5.8 VLCR STR LF (GAUZE/BANDAGES/DRESSINGS) ×3 IMPLANT
BNDG ELASTIC 4X5.8 VLCR STR LF (GAUZE/BANDAGES/DRESSINGS) ×3 IMPLANT
BNDG ESMARK 4X9 LF (GAUZE/BANDAGES/DRESSINGS) ×3 IMPLANT
BNDG GAUZE ELAST 4 BULKY (GAUZE/BANDAGES/DRESSINGS) ×3 IMPLANT
BUR OVAL CARBIDE 4.0 (BURR) ×1 IMPLANT
CHLORAPREP W/TINT 26 (MISCELLANEOUS) ×3 IMPLANT
COVER BACK TABLE 60X90IN (DRAPES) ×3 IMPLANT
CUFF TOURN SGL QUICK 18X4 (TOURNIQUET CUFF) ×1 IMPLANT
DRAPE 3/4 80X56 (DRAPES) ×3 IMPLANT
DRAPE C-ARM 35X43 STRL (DRAPES) ×3 IMPLANT
DRAPE EXTREMITY T 121X128X90 (DISPOSABLE) ×3 IMPLANT
DRAPE OEC MINIVIEW 54X84 (DRAPES) ×3 IMPLANT
DRAPE U-SHAPE 47X51 STRL (DRAPES) ×3 IMPLANT
ELECT REM PT RETURN 9FT ADLT (ELECTROSURGICAL) ×3
ELECTRODE REM PT RTRN 9FT ADLT (ELECTROSURGICAL) ×2 IMPLANT
GAUZE 4X4 16PLY ~~LOC~~+RFID DBL (SPONGE) ×3 IMPLANT
GAUZE SPONGE 4X4 12PLY STRL (GAUZE/BANDAGES/DRESSINGS) ×3 IMPLANT
GAUZE XEROFORM 1X8 LF (GAUZE/BANDAGES/DRESSINGS) ×3 IMPLANT
GLOVE SRG 8 PF TXTR STRL LF DI (GLOVE) ×2 IMPLANT
GLOVE SURG ENC MOIS LTX SZ8 (GLOVE) ×4 IMPLANT
GLOVE SURG NEOPR MICRO LF SZ6 (GLOVE) ×3 IMPLANT
GLOVE SURG UNDER POLY LF SZ6 (GLOVE) ×1 IMPLANT
GLOVE SURG UNDER POLY LF SZ7 (GLOVE) ×2 IMPLANT
GLOVE SURG UNDER POLY LF SZ8 (GLOVE) ×3
GOWN SPEC L3 XXLG W/TWL (GOWN DISPOSABLE) ×3 IMPLANT
GOWN STRL REUS W/TWL 2XL LVL3 (GOWN DISPOSABLE) ×3 IMPLANT
GOWN STRL REUS W/TWL LRG LVL3 (GOWN DISPOSABLE) ×3 IMPLANT
IMPL TOE SZ 3 (Toe) IMPLANT
IMPLANT TOE SZ 3 (Toe) ×3 IMPLANT
K-WIRE CAPS STERILE WHITE .045 (WIRE) ×1 IMPLANT
K-WIRE DBL TROCAR .045X4 (WIRE) ×6
K-Wire 1.1 mm(0.045") x 102mm (4") ×2 IMPLANT
KIT TURNOVER CYSTO (KITS) ×3 IMPLANT
KWIRE DBL TROCAR .045X4 (WIRE) IMPLANT
MANIFOLD NEPTUNE II (INSTRUMENTS) ×1 IMPLANT
NDL HYPO 25X1 1.5 SAFETY (NEEDLE) ×2 IMPLANT
NEEDLE HYPO 25X1 1.5 SAFETY (NEEDLE) ×3 IMPLANT
NS IRRIG 500ML POUR BTL (IV SOLUTION) ×4 IMPLANT
PACK BASIN DAY SURGERY FS (CUSTOM PROCEDURE TRAY) ×3 IMPLANT
PENCIL SMOKE EVACUATOR (MISCELLANEOUS) ×3 IMPLANT
RASP BONE SMALL TEAR RASP (RASP) IMPLANT
RASP SM TEAR CROSS CUT (RASP) ×1 IMPLANT
STOCKINETTE 6  STRL (DRAPES) ×3
STOCKINETTE 6 STRL (DRAPES) ×2 IMPLANT
SUCTION FRAZIER HANDLE 10FR (MISCELLANEOUS) ×3
SUCTION TUBE FRAZIER 10FR DISP (MISCELLANEOUS) ×2 IMPLANT
SUT ETHILON 4 0 PS 2 18 (SUTURE) ×5 IMPLANT
SUT MNCRL AB 3-0 PS2 18 (SUTURE) ×3 IMPLANT
SUT MNCRL AB 4-0 PS2 18 (SUTURE) ×4 IMPLANT
SUT VIC AB 0 CT1 27 (SUTURE) ×3
SUT VIC AB 0 CT1 27XBRD ANBCTR (SUTURE) IMPLANT
SUT VIC AB 2-0 SH 27 (SUTURE) ×3
SUT VIC AB 2-0 SH 27XBRD (SUTURE) ×2 IMPLANT
SYR BULB EAR ULCER 3OZ GRN STR (SYRINGE) ×3 IMPLANT
SYR CONTROL 10ML LL (SYRINGE) ×3 IMPLANT
TOWEL OR 17X26 10 PK STRL BLUE (TOWEL DISPOSABLE) ×3 IMPLANT
TUBE CONNECTING 12X1/4 (SUCTIONS) ×3 IMPLANT
UNDERPAD 30X36 HEAVY ABSORB (UNDERPADS AND DIAPERS) ×3 IMPLANT

## 2021-08-16 NOTE — Progress Notes (Signed)
Orthopedic Tech Progress Note Patient Details:  Leslie Parsons [birth year] [id number]  Patient ID: Leslie Parsons, female   DOB: 05/17/1941, 81 y.o.   MRN: [id number]  Leslie Parsons 08/16/2021, 9:59 AM Post op shoe applied to right foot in [facility] pacu

## 2021-08-16 NOTE — H&P (Signed)
°  Subjective:  Patient ID: Leslie Parsons, female    DOB: 12-Dec-1940,  MRN: [id number]  No chief complaint on file.  81 y.o. female presents for elective correction of right foot deformities.  Of note since last visit she requests injection to her left foot for arthritis. She has pain at the proximal midfoot. This area was marked today. States she had injections at this area before and it helped.  Objective:  Physical Exam: warm, good capillary refill, no trophic changes or ulcerative lesions, normal DP and PT pulses, and normal sensory exam. Left Foot: hallux valgus with mild POP, 2nd/3rd toe hammertoes with POP 2nd PIPJ and 3rd distal aspect of the toe. 3rd toe longer than remaining digits. No callosities. No pain at the 2nd/3rd MPJ    No images are attached to the encounter.  Radiographs: 11/7/22X-ray of the right foot: Moderate/severe bunion deformity with 1st MPJ degenerative changes. Lateral deviation of remaining digits. Decreased bone stock. Assessment:   Hammertoe, bunion right Arhtritis left foot   Plan:  Patient was evaluated and treated and all questions answered.  Bunion and Hammertoe - Consent reviewed and signed - All questions for surgery answered. -Planned procedures: Right foot correction of hammerteos 2nd/3rd toes with pin fixation, correction of bunion with osteotomy or joint replacement; left midfoot injection  No follow-ups on file.

## 2021-08-16 NOTE — Op Note (Signed)
Patient Name: Leslie Parsons DOB: [birth year]  MRN: [id number]   Date of Surgery: 08/16/2021  Surgeon: Dr. Hardie Pulley, DPM Assistants: none  Pre-operative Diagnosis:  Hallux valgus, hammertoes Post-operative Diagnosis:  * No Diagnosis Codes entered * Procedures:  1) Right 1st metatarsophalangeal joint replacement  2) Right 2nd hammertoe deformity  3) Right 3rd hammertoe deformity Pathology/Specimens: * No specimens in log * Anesthesia: MAC/local Hemostasis:  Total Tourniquet Time Documented: Calf (Right) - 62 minutes Total: Calf (Right) - 62 minutes  Estimated Blood Loss: 5 mL Materials:  Implant Name Type Inv. Item Serial No. Manufacturer Lot No. LRB No. Used Action  WRIGHT SWANSON FLEXSPAN FLEXIBLE HINGE TOEW/ GROMMETS     [id number] Right 1 Implanted  K-Wire 1.1 mm(0.045") x 18m (4")   [id number]  [id number] 2 Implanted   Medications: none Complications: none  Indications for Procedure:  This is a 81y.o. female with chronic deformity of the 1st metatarsophalangeal joint and 2nd/3rd toes. It was discussed she would benefit from surgical correction. All risks, benefits, and alternatives were discussed. No guarantees were given.   Procedure in Detail: Patient was identified in pre-operative holding area. Formal consent was signed and the right lower extremity was marked. Patient was brought back to the operating room. Anesthesia was induced. The extremity was prepped and draped in the usual sterile fashion. Timeout was taken to confirm patient name, laterality, and procedure prior to incision.   Attention was directed to the dorsal aspect of right 1st MPJ where a longitudinal incision was made from the midpoint of the first metatarsal extending over the metatarsophalangeal joint on to the great toe. This was deepened by sharp dissection Skin and subcutaneous tissues were reflected. A linear type capsulotomy was performed  with subperiosteal dissection of distal  portion of the first metatarsal and proximal portion of the hallucal proximal phalanx. Large osteophytes were noted at this time at the medial, lateral, and dorsal joint margins. A large articular defect was noted at the dorsal central 1st metatarsal articular surface. The cut guide was placed in the joint and both the base of the proximal phalanx and 1st metatarsal were cut with sagittal saw. the cut guide was removed and the cuts were completed. The fragments were excised. The joint was sized for the [facility] toe implant. An initial pilot hole in the remaining stump of the proximal phalanx was created followed by broaching with the appropriate broach. The 1st metatarsal intramedulllary canal was enlarged for the stem of the  implant.This was also broached. Trials were inserted  and the appropriate size selected The grommets were seated on each side of the joint. The implant was then inserted seated well with excellent ROM of the 1st MPJ. The wound was again irrigated and capsular repair performed with simple interrupted sutures of 3-0 Vicryl with the toe held in a straight and neutral position. Subcutaneous tissues were reapproximated with simple interrupted suture of 4-0 Vicryl. The skin was re-approximated with 4-0 nylon.  Attention was then directed to the 2nd toe.  Incision was made at the dorsal to the digit centered over the interphalangeal joint dissection was carried down to the level of the extensor tendon.  The extensor tendon was transected distal to the proximal phalangeal joint.  The tendon was freed off the proximal phalanx and the phalangeal head was exposed.  The proximal phalangeal head was excised with a sagittal saw.  The middle phalangeal base was then resected with sagittal saw to denude it of  cartilage.    At this point the toe was able to be reduced to rectus position. The proximal phalanx was then drilled with a K wire and the wire was then passed up the distal aspect the toe  through the middle phalanx with care to center the wire on the toe. The toe was then reduced the pin was passed retrograde and positioning was checked on fluoroscopy.    This same procedure was then repeated for the 3rd toe. The wounds were then irrigated and closed in layers with 4-0 vicryl and 4-0 nylon. Sterile dressing of xeroform, 4x4, kerlix, and ACE bandage.   The tourniquet was released and digital circulation returned promptly. The patient tolerated surgery and anesthesia well. The patient was authorized for transport to the PACU in good condition with vital signs stable. The patient will be discharged home later today in a surgical boot with instructions for limited activities.   Disposition: Following a period of post-operative monitoring, patient will be transferred home.

## 2021-08-16 NOTE — Brief Op Note (Signed)
08/16/2021  9:22 AM  PATIENT:  Peyton Bottoms  81 y.o. female  PRE-OPERATIVE DIAGNOSIS:  BUNION AND HAMMERTOE RIGHT FOOT  POST-OPERATIVE DIAGNOSIS:  BUNION AND HAMMERTOE RIGHT FOOT  PROCEDURE:  Procedure(s): HAMMER TOE REPAIR 2ND, 3RD TOE RIGHT FOOT (Right) KELLER BUNION IMPLANT (Right) LEFT foot injection of midfoot arthritis  (Left)  SURGEON:  Surgeon(s) and Role:    Evelina Bucy, DPM - Primary  PHYSICIAN ASSISTANT:   ASSISTANTS: none   ANESTHESIA:   local and MAC  EBL:  5 mL   BLOOD ADMINISTERED:none  DRAINS: none   LOCAL MEDICATIONS USED:  MARCAINE    and Amount: 23 ml Mayo and digital block3; Exparel 10 ml ankle block  SPECIMEN:  No Specimen  DISPOSITION OF SPECIMEN:  N/A  COUNTS:  YES  TOURNIQUET:   Total Tourniquet Time Documented: Calf (Right) - 62 minutes Total: Calf (Right) - 62 minutes   DICTATION: .Viviann Spare Dictation  PLAN OF CARE: Discharge to home after PACU  PATIENT DISPOSITION:  PACU - hemodynamically stable.   Delay start of Pharmacological VTE agent (>24hrs) due to surgical blood loss or risk of bleeding: not applicable

## 2021-08-16 NOTE — Discharge Instructions (Addendum)
After Surgery Instructions   1) If you are recuperating from surgery anywhere other than home, please be sure to leave [place] the number where you can be reached.  2) Go directly home and rest.  3) Keep the operated foot(feet) elevated six inches above the hip when sitting or lying down. This will help control swelling and pain.  4) Support the elevated foot and leg with pillows. DO NOT PLACE PILLOWS UNDER THE KNEE.  5) DO NOT REMOVE or get your bandages WET, unless you were given different instructions by your doctor to do so. This increases the risk of infection.  6) Wear your surgical shoe or surgical boot at all times when you are up on your feet.  7) A limited amount of pain and swelling may occur. The skin may take on a bruised appearance. DO NOT BE ALARMED, THIS IS NORMAL.  8) For slight pain and swelling, apply an ice pack directly over the bandages for 15 minutes only out of each hour of the day. Continue until seen in the office for your first post op visit. DO NOT APPLY ANY FORM OF HEAT TO THE AREA.  9) Have prescriptions filled immediately and take as directed.  10) Drink lots of liquids, water and juice to stay hydrated.  11) CALL IMMEDIATELY IF:  *Bleeding continues until the following day of surgery  *Pain increases and/or does not respond to medication  *Bandages or cast appears to tight  *If your bandage gets wet  *Trip, fall or stump your surgical foot  *If your temperature goes above 101  *If you have ANY questions at all  12) You are expected to be weightbearing after your surgery.   If you need to reach the nurse for any reason, please call: [place]: [phone] [place]: [phone] [place]: [phone]      Post Anesthesia Home Care Instructions  Activity: Get plenty of rest for the remainder of the day. A responsible individual must stay with you for 24 hours following the procedure.  For the next 24 hours, DO  NOT: -Drive a car -Paediatric nurse -Drink alcoholic beverages -Take any medication unless instructed by your physician -Make any legal decisions or sign important papers.  Meals: Start with liquid foods such as gelatin or soup. Progress to regular foods as tolerated. Avoid greasy, spicy, heavy foods. If nausea and/or vomiting occur, drink only clear liquids until the nausea and/or vomiting subsides. Call your physician if vomiting continues.  Special Instructions/Symptoms: Your throat may feel dry or sore from the anesthesia or the breathing tube placed in your throat during surgery. If this causes discomfort, gargle with warm salt water. The discomfort should disappear within 24 hours.    Do not take any Tyelnol until after 12:00 pm today.      Information for Discharge Teaching: EXPAREL (bupivacaine liposome injectable suspension)   Your surgeon or anesthesiologist gave you EXPAREL(bupivacaine) to help control your pain after surgery.  EXPAREL is a local anesthetic that provides pain relief by numbing the tissue around the surgical site. EXPAREL is designed to release pain medication over time and can control pain for up to 72 hours. Depending on how you respond to EXPAREL, you may require less pain medication during your recovery.  Possible side effects: Temporary loss of sensation or ability to move in the area where bupivacaine was injected. Nausea, vomiting, constipation Rarely, numbness and tingling in your mouth or lips, lightheadedness, or anxiety may occur. Call your doctor right away if  you think you may be experiencing any of these sensations, or if you have other questions regarding possible side effects.  Follow all other discharge instructions given to you by your surgeon or nurse. Eat a healthy diet and drink plenty of water or other fluids.  If you return to the hospital for any reason within 96 hours following the administration of EXPAREL, it is important for  health care providers to know that you have received this anesthetic. A teal colored band has been placed on your arm with the date, time and amount of EXPAREL you have received in order to alert and inform your health care providers. Please leave this armband in place for the full 96 hours following administration, and then you may remove the band.

## 2021-08-16 NOTE — Anesthesia Postprocedure Evaluation (Signed)
Anesthesia Post Note  Patient: Leslie Parsons  Procedure(s) Performed: HAMMER TOE REPAIR 2ND, 3RD TOE RIGHT FOOT (Right: Toe) KELLER BUNION IMPLANT (Right: Toe) LEFT foot injection of midfoot arthritis  (Left: Foot)     Patient location during evaluation: Phase II Anesthesia Type: MAC Level of consciousness: awake and alert, patient cooperative and oriented Pain management: pain level controlled Vital Signs Assessment: post-procedure vital signs reviewed and stable Respiratory status: spontaneous breathing, nonlabored ventilation and respiratory function stable Cardiovascular status: blood pressure returned to baseline and stable Postop Assessment: no apparent nausea or vomiting and adequate PO intake Anesthetic complications: no   No notable events documented.  Last Vitals:  Vitals:   08/16/21 0551 08/16/21 0923  BP: (!) 125/91 139/60  Pulse: 68 61  Resp: 17 16  Temp: 36.5 C 36.4 C  SpO2: 97% 96%    Last Pain:  Vitals:   08/16/21 0923  TempSrc: Oral  PainSc: 0-No pain                 Adalyn Pennock,E. Barbarann Kelly

## 2021-08-16 NOTE — Anesthesia Procedure Notes (Signed)
Procedure Name: MAC Date/Time: 08/16/2021 7:30 AM Performed by: Bonney Aid, CRNA Pre-anesthesia Checklist: Patient identified, Emergency Drugs available, Suction available, Patient being monitored and Timeout performed Patient Re-evaluated:Patient Re-evaluated prior to induction Oxygen Delivery Method: Non-rebreather mask Placement Confirmation: positive ETCO2

## 2021-08-16 NOTE — Transfer of Care (Signed)
Immediate Anesthesia Transfer of Care Note  Patient: Leslie Parsons  Procedure(s) Performed: HAMMER TOE REPAIR 2ND, 3RD TOE RIGHT FOOT (Right: Toe) KELLER BUNION IMPLANT (Right: Toe) LEFT foot injection of midfoot arthritis  (Left: Foot)  Patient Location: PACU and Short Stay  Anesthesia Type:MAC  Level of Consciousness: awake, alert  and oriented  Airway & Oxygen Therapy: Patient Spontanous Breathing  Post-op Assessment: Report given to RN  Post vital signs: Reviewed and stable  Last Vitals:  Vitals Value Taken Time  BP 139/60 08/16/21 0923  Temp    Pulse 56 08/16/21 0924  Resp    SpO2 98 % 08/16/21 0924  Vitals shown include unvalidated device data.  Last Pain:  Vitals:   08/16/21 0551  TempSrc: Oral  PainSc: 0-No pain      Patients Stated Pain Goal: 3 (97/41/63 8453)  Complications: No notable events documented.

## 2021-08-17 ENCOUNTER — Encounter (HOSPITAL_BASED_OUTPATIENT_CLINIC_OR_DEPARTMENT_OTHER): Payer: Self-pay | Admitting: Podiatry

## 2021-08-21 ENCOUNTER — Encounter: Payer: Self-pay | Admitting: Podiatry

## 2021-08-21 ENCOUNTER — Ambulatory Visit (INDEPENDENT_AMBULATORY_CARE_PROVIDER_SITE_OTHER): Payer: Medicare HMO

## 2021-08-21 ENCOUNTER — Ambulatory Visit (INDEPENDENT_AMBULATORY_CARE_PROVIDER_SITE_OTHER): Payer: Medicare HMO | Admitting: Podiatry

## 2021-08-21 VITALS — Temp 97.3°F

## 2021-08-21 DIAGNOSIS — M2041 Other hammer toe(s) (acquired), right foot: Secondary | ICD-10-CM | POA: Diagnosis not present

## 2021-08-21 NOTE — Progress Notes (Signed)
°  Subjective:  Patient ID: Leslie Parsons, female    DOB: 1940-12-28,  MRN: [id number]  Chief Complaint  Patient presents with   Routine Post Op    I am doing ok on the right foot and there is some burning and I had a shower today and the bag had a hole in it    DOS: 08/16/21 Procedure: Right Foot correction of bunion with implant, correction hammertoes 2nd/3rd toes  81 y.o. female presents with the above complaint. History confirmed with patient. Overall doing very well post-operatively not having much pain. Did get dressing wet earlier today. Denies pain at present. Oral temp 97.3.  Objective:  Physical Exam: tenderness at the surgical site, local edema noted, calf supple, nontender, bruising, contusion noted, toes rectus, and good joint ROM. Incision: healing well, no significant drainage, slight clear drainage present  No images are attached to the encounter.  Radiographs: X-ray of the right foot: consistent with post-op state, with good alignment and no evidence of hardware complication  Assessment:   1. Hammertoe of second toe of right foot     Plan:  Patient was evaluated and treated and all questions answered.  Post-operative State -XR reviewed with patient -Dressing applied consisting of betadine, sterile gauze, kerlix, and ACE bandage -WBAT in Surgical shoe -XRs needed at follow-up: none   No follow-ups on file.

## 2021-09-04 ENCOUNTER — Ambulatory Visit (INDEPENDENT_AMBULATORY_CARE_PROVIDER_SITE_OTHER): Payer: Medicare HMO

## 2021-09-04 ENCOUNTER — Encounter: Payer: Self-pay | Admitting: Podiatry

## 2021-09-04 ENCOUNTER — Ambulatory Visit (INDEPENDENT_AMBULATORY_CARE_PROVIDER_SITE_OTHER): Payer: Medicare HMO | Admitting: Podiatry

## 2021-09-04 DIAGNOSIS — M79671 Pain in right foot: Secondary | ICD-10-CM

## 2021-09-04 DIAGNOSIS — M2041 Other hammer toe(s) (acquired), right foot: Secondary | ICD-10-CM | POA: Diagnosis not present

## 2021-09-04 DIAGNOSIS — Z9889 Other specified postprocedural states: Secondary | ICD-10-CM

## 2021-09-04 DIAGNOSIS — M79672 Pain in left foot: Secondary | ICD-10-CM

## 2021-09-04 DIAGNOSIS — M19072 Primary osteoarthritis, left ankle and foot: Secondary | ICD-10-CM

## 2021-09-04 NOTE — Progress Notes (Signed)
°  Subjective:  Patient ID: Leslie Parsons, female    DOB: 02-11-1941,  MRN: [id number]  Chief Complaint  Patient presents with   Routine Post Op    I am doing good and I am ready to get back to my activities on my right foot    DOS: 08/16/21 Procedure: Right Foot correction of bunion with implant, correction hammertoes 2nd/3rd toes  81 y.o. female presents with the above complaint. History confirmed with patient. Doing very well. Has maintained WB restrictions. Denies pain or other post-op issues  Objective:  Physical Exam: tenderness at the surgical site, local edema noted, calf supple, nontender, bruising, contusion noted, toes rectus, and good joint ROM. Incision: healing well, no significant drainage, slight clear drainage present  No images are attached to the encounter.  Radiographs: X-ray of the right foot: consistent with post-op state, with good alignment and no evidence of hardware complication - interval backing out of 2nd toe pin  Assessment:   1. Hammertoe of second toe of right foot   2. Arthritis of foot, left   3. Bilateral foot pain   4. Post-operative state     Plan:  Patient was evaluated and treated and all questions answered.  Post-operative State -XR reviewed with patient -Sutures removed. Steri strips applied -Pin pulled 2nd toe due to backing out. -WBAT in Surgical shoe -XRs needed at follow-up: 3 view Foot -Plan for pin removal 3rd toe and progression of WB.  Return in about 2 weeks (around 09/18/2021) for Post-Op (with XRs).

## 2021-09-21 ENCOUNTER — Encounter: Payer: Medicare HMO | Admitting: Podiatry

## 2021-09-25 ENCOUNTER — Ambulatory Visit (INDEPENDENT_AMBULATORY_CARE_PROVIDER_SITE_OTHER): Payer: Medicare HMO | Admitting: Podiatry

## 2021-09-25 ENCOUNTER — Encounter: Payer: Self-pay | Admitting: Podiatry

## 2021-09-25 ENCOUNTER — Other Ambulatory Visit: Payer: Self-pay

## 2021-09-25 ENCOUNTER — Ambulatory Visit (INDEPENDENT_AMBULATORY_CARE_PROVIDER_SITE_OTHER): Payer: Medicare HMO

## 2021-09-25 DIAGNOSIS — M2041 Other hammer toe(s) (acquired), right foot: Secondary | ICD-10-CM | POA: Diagnosis not present

## 2021-09-25 DIAGNOSIS — Z9889 Other specified postprocedural states: Secondary | ICD-10-CM

## 2021-09-25 NOTE — Progress Notes (Signed)
?  Subjective:  ?Patient ID: Leslie Parsons, female    DOB: [birth year],  MRN: [id number] ? ?Chief Complaint  ?Patient presents with  ? Routine Post Op  ?  I am doing good and I am ready for the pin to come out of the right foot   ? ?DOS: 08/16/21 ?Procedure: Right Foot correction of bunion with implant, correction hammertoes 2nd/3rd toes ? ?81 y.o. female presents with the above complaint. History confirmed with patient. Doing very well. No pain.  ? ?Objective:  ?Physical Exam: tenderness at the surgical site, local edema noted, calf supple, nontender, bruising, contusion noted, toes rectus, and good joint ROM. ?Incision: healing well, no significant drainage, slight clear drainage present ? ?No images are attached to the encounter. ? ?Radiographs: ?X-ray of the right foot: consistent with post-op state, with good alignment and no evidence of hardware complication - interval removal of second toe pin.  ? ?Assessment:  ? ?1. Hammertoe of second toe of right foot   ?2. Post-operative state   ? ? ?Plan:  ?Patient was evaluated and treated and all questions answered. ? ?Post-operative State ?-XR reviewed with patient ?-3rd toe pin removed.  ?-May start showering in two days.  ?-WBAT in Surgical shoe may begin to transition into regular shoes in 2 weeks.  ?-XRs needed at follow-up: 3 view Foot ?-Follow-up in 3 weeks  ? ?Return in about 3 weeks (around 10/16/2021) for post op.  ? ?

## 2021-09-27 DIAGNOSIS — D225 Melanocytic nevi of trunk: Secondary | ICD-10-CM | POA: Diagnosis not present

## 2021-09-27 DIAGNOSIS — L578 Other skin changes due to chronic exposure to nonionizing radiation: Secondary | ICD-10-CM | POA: Diagnosis not present

## 2021-09-27 DIAGNOSIS — L728 Other follicular cysts of the skin and subcutaneous tissue: Secondary | ICD-10-CM | POA: Diagnosis not present

## 2021-09-27 DIAGNOSIS — L820 Inflamed seborrheic keratosis: Secondary | ICD-10-CM | POA: Diagnosis not present

## 2021-09-27 DIAGNOSIS — L814 Other melanin hyperpigmentation: Secondary | ICD-10-CM | POA: Diagnosis not present

## 2021-10-23 ENCOUNTER — Ambulatory Visit (INDEPENDENT_AMBULATORY_CARE_PROVIDER_SITE_OTHER): Payer: Medicare HMO

## 2021-10-23 ENCOUNTER — Encounter: Payer: Self-pay | Admitting: Podiatry

## 2021-10-23 ENCOUNTER — Ambulatory Visit (INDEPENDENT_AMBULATORY_CARE_PROVIDER_SITE_OTHER): Payer: Medicare HMO | Admitting: Podiatry

## 2021-10-23 DIAGNOSIS — M2041 Other hammer toe(s) (acquired), right foot: Secondary | ICD-10-CM | POA: Diagnosis not present

## 2021-10-23 DIAGNOSIS — Z9889 Other specified postprocedural states: Secondary | ICD-10-CM

## 2021-10-23 NOTE — Progress Notes (Signed)
?  Subjective:  ?Patient ID: Leslie Parsons, female    DOB: [birth year],  MRN: [id number] ? ?No chief complaint on file. ? ?DOS: 08/16/21 ?Procedure: Right Foot correction of bunion with implant, correction hammertoes 2nd/3rd toes ? ?81 y.o. female presents with the above complaint. History confirmed with patient. Doing very well. No pain.  ? ?Objective:  ?Physical Exam: tenderness at the surgical site, local edema noted, calf supple, nontender, bruising, contusion noted, toes rectus, and good joint ROM. ?Incision: healing well, no significant drainage, slight clear drainage present ? ?No images are attached to the encounter. ? ?Radiographs: ?X-ray of the right foot: consistent with post-op state, with good alignment and no evidence of hardware complication - interval removal of second toe pin.  ? ?Assessment:  ? ?1. Post-operative state   ? ? ? ?Plan:  ?Patient was evaluated and treated and all questions answered. ? ?Post-operative State ?-XR reviewed with patient ?-Continue WBAT in regular shoes.  ?-Follow-up as needed for midfoot arthiritis pain on the left.  ? ? ?No follow-ups on file.  ? ?

## 2021-11-03 DIAGNOSIS — S52612A Displaced fracture of left ulna styloid process, initial encounter for closed fracture: Secondary | ICD-10-CM | POA: Diagnosis not present

## 2021-11-03 DIAGNOSIS — R600 Localized edema: Secondary | ICD-10-CM | POA: Diagnosis not present

## 2021-11-03 DIAGNOSIS — M7989 Other specified soft tissue disorders: Secondary | ICD-10-CM | POA: Diagnosis not present

## 2021-11-03 DIAGNOSIS — S52532A Colles' fracture of left radius, initial encounter for closed fracture: Secondary | ICD-10-CM | POA: Diagnosis not present

## 2021-11-04 DIAGNOSIS — M7989 Other specified soft tissue disorders: Secondary | ICD-10-CM | POA: Diagnosis not present

## 2021-11-04 DIAGNOSIS — S52502A Unspecified fracture of the lower end of left radius, initial encounter for closed fracture: Secondary | ICD-10-CM | POA: Diagnosis not present

## 2021-11-08 ENCOUNTER — Encounter: Payer: Self-pay | Admitting: Oncology

## 2021-11-08 DIAGNOSIS — M8589 Other specified disorders of bone density and structure, multiple sites: Secondary | ICD-10-CM | POA: Diagnosis not present

## 2021-11-08 DIAGNOSIS — M8588 Other specified disorders of bone density and structure, other site: Secondary | ICD-10-CM | POA: Diagnosis not present

## 2021-11-08 DIAGNOSIS — S52502A Unspecified fracture of the lower end of left radius, initial encounter for closed fracture: Secondary | ICD-10-CM | POA: Diagnosis not present

## 2021-11-08 DIAGNOSIS — S52602A Unspecified fracture of lower end of left ulna, initial encounter for closed fracture: Secondary | ICD-10-CM | POA: Diagnosis not present

## 2021-11-08 DIAGNOSIS — M810 Age-related osteoporosis without current pathological fracture: Secondary | ICD-10-CM | POA: Diagnosis not present

## 2021-11-10 DIAGNOSIS — S52612A Displaced fracture of left ulna styloid process, initial encounter for closed fracture: Secondary | ICD-10-CM | POA: Diagnosis not present

## 2021-11-10 DIAGNOSIS — Z9889 Other specified postprocedural states: Secondary | ICD-10-CM | POA: Diagnosis not present

## 2021-11-10 DIAGNOSIS — G8918 Other acute postprocedural pain: Secondary | ICD-10-CM | POA: Diagnosis not present

## 2021-11-10 DIAGNOSIS — S52552A Other extraarticular fracture of lower end of left radius, initial encounter for closed fracture: Secondary | ICD-10-CM | POA: Diagnosis not present

## 2021-11-10 DIAGNOSIS — S52502A Unspecified fracture of the lower end of left radius, initial encounter for closed fracture: Secondary | ICD-10-CM | POA: Diagnosis not present

## 2021-11-10 DIAGNOSIS — S52572A Other intraarticular fracture of lower end of left radius, initial encounter for closed fracture: Secondary | ICD-10-CM | POA: Diagnosis not present

## 2021-11-10 DIAGNOSIS — R2689 Other abnormalities of gait and mobility: Secondary | ICD-10-CM | POA: Diagnosis not present

## 2021-11-10 DIAGNOSIS — S52592A Other fractures of lower end of left radius, initial encounter for closed fracture: Secondary | ICD-10-CM | POA: Diagnosis not present

## 2021-11-10 DIAGNOSIS — M85832 Other specified disorders of bone density and structure, left forearm: Secondary | ICD-10-CM | POA: Diagnosis not present

## 2021-11-10 DIAGNOSIS — M19032 Primary osteoarthritis, left wrist: Secondary | ICD-10-CM | POA: Diagnosis not present

## 2021-11-27 DIAGNOSIS — S52502A Unspecified fracture of the lower end of left radius, initial encounter for closed fracture: Secondary | ICD-10-CM | POA: Diagnosis not present

## 2021-11-27 DIAGNOSIS — S52602A Unspecified fracture of lower end of left ulna, initial encounter for closed fracture: Secondary | ICD-10-CM | POA: Diagnosis not present

## 2021-12-04 ENCOUNTER — Inpatient Hospital Stay: Payer: Medicare HMO | Attending: Oncology

## 2021-12-04 ENCOUNTER — Other Ambulatory Visit: Payer: Self-pay | Admitting: Oncology

## 2021-12-04 ENCOUNTER — Inpatient Hospital Stay (INDEPENDENT_AMBULATORY_CARE_PROVIDER_SITE_OTHER): Payer: Medicare HMO | Admitting: Oncology

## 2021-12-04 VITALS — BP 171/84 | HR 73 | Temp 98.7°F | Resp 16 | Ht 65.0 in | Wt 147.7 lb

## 2021-12-04 DIAGNOSIS — M810 Age-related osteoporosis without current pathological fracture: Secondary | ICD-10-CM | POA: Diagnosis not present

## 2021-12-04 DIAGNOSIS — C50411 Malignant neoplasm of upper-outer quadrant of right female breast: Secondary | ICD-10-CM

## 2021-12-04 DIAGNOSIS — Z170 Estrogen receptor positive status [ER+]: Secondary | ICD-10-CM

## 2021-12-04 DIAGNOSIS — D649 Anemia, unspecified: Secondary | ICD-10-CM | POA: Diagnosis not present

## 2021-12-04 LAB — HEPATIC FUNCTION PANEL
ALT: 18 U/L (ref 7–35)
AST: 27 (ref 13–35)
Alkaline Phosphatase: 83 (ref 25–125)
Bilirubin, Total: 0.5

## 2021-12-04 LAB — CBC AND DIFFERENTIAL
HCT: 38 (ref 36–46)
Hemoglobin: 12.6 (ref 12.0–16.0)
Neutrophils Absolute: 2.07
Platelets: 280 10*3/uL (ref 150–400)
WBC: 4.5

## 2021-12-04 LAB — BASIC METABOLIC PANEL
BUN: 14 (ref 4–21)
CO2: 29 — AB (ref 13–22)
Chloride: 98 — AB (ref 99–108)
Creatinine: 0.7 (ref 0.5–1.1)
Glucose: 102
Potassium: 4.5 mEq/L (ref 3.5–5.1)
Sodium: 137 (ref 137–147)

## 2021-12-04 LAB — COMPREHENSIVE METABOLIC PANEL
Albumin: 4.5 (ref 3.5–5.0)
Calcium: 10.2 (ref 8.7–10.7)

## 2021-12-04 LAB — CBC: RBC: 4.3 (ref 3.87–5.11)

## 2021-12-04 NOTE — Progress Notes (Addendum)
ADDENDUM:   Due to osteoporosis on her current bone density scan and a fall with a fracture of her left forearm, I recommended Prolia injections every 6 months.  We were able to get authorization from her insurance but she refused to have the injection scheduled.  I went back to her prior bone density of December 2019 and her left hip had a T score of -2.8 for osteoporosis.  The current one from April 2023 is even worse with a T score of -3.4.  If she is not willing to consider Prolia injections or resuming alendronate, we may need to think about stopping her anastrozole prior to 5 years when we weigh the risk versus benefit.  We will plan to discuss this with her when she returns in 6 months.   [facility]  [address] [place],  [place]  [address] [phone]  Clinic Day: 12/04/21  Referring physician: Ronita Hipps, MD  ASSESSMENT & PLAN:   1. Stage IA hormone receptor positive right breast cancer diagnosed in October 2019.  She remains without evidence of recurrence.  She knows to continue anastrozole for least a total of 5 years, until December 2024.  2. Osteoporosis.  She was on alendronate 70 mg weekly but discontinued this. She does continue oral calcium supplement, and she will be starting high dose vitamin D.  A repeat bone density in April 2023.  She knows to continue anastrozole daily.  She will be scheduled for denosumab injection, and I will call her after we obtain the authorization from her insurance.  This would be given every 6 months and I reviewed the potential toxicities with her.  She will then return in 6 months with CBC and comprehensive metabolic profile for her second Prolia injection.  The patient understands the plans discussed today and is in agreement with them.  She knows to contact our office if she develops concerns regarding her breast cancer or its treatment.    I provided 30 minutes of face-to-face time during this this  encounter and > 50% was spent counseling as documented under my assessment and plan.    ADDENDUM: We were able to obtain authorization from her insurance for the Prolia injections.  However when we called the patient to schedule this, she refused to proceed.  I will discuss this with her again at her next appointment and we may want to consider stopping the anastrozole sooner if she has no therapy for her bones.  I will see if I can find a comparison baseline to see how much it has changed.   Derwood Kaplan, MD [facility] [address] [place] [address] Dept: [phone] Dept Fax: [phone]   CHIEF COMPLAINT:  CC: Stage IA hormone receptor positive right breast cancer  Current Treatment:  Anastrozole 1 mg daily for 5 years   HISTORY OF PRESENT ILLNESS:  Leslie Parsons is a 81 y.o. female with stage IA (T1c N0 M0) hormone receptor positive right breast cancer diagnosed in November 2019.  Screening mammogram in October 2019 revealed a possible mass on the right side.  Right diagnostic mammogram in November 2019 revealed a spiculated mass in the upper outer quadrant.  Ultrasound revealed an angulated hypoechoic mass at 11:30, which was 5 cm from the nipple and measured 1.9 cm.  There was a second lesion at 10 o'clock, measuring 0.6 cm, which was hypoechoic and slightly irregular.  These 2 masses were separated by 3.4 cm.  Right axillary ultrasound was negative.  Ultrasound-guided biopsy of the larger lesion at 11:30 revealed grade 2, invasive ductal carcinoma involving all cores, measuring up to 12 mm in length, as well as focal ductal carcinoma in situ.  Estrogen receptors were positive at 95% and progesterone receptors 35%.  HER 2 was negative.  Ki 67 was 15%.  Biopsy of the lesion at 10 o'clock revealed a fibroadenoma.  She was treated with lumpectomy and sentinel lymph node in December.  Final pathology  revealed a 19 mm, grade 2, invasive ductal carcinoma with intermediate grade ductal carcinoma in situ.  One sentinel node was negative for metastasis.  Margins were clear .  The fibroadenoma was resected as well.  She developed a postoperative seroma in the right axilla, which which was drained and did not recur.  Bone density scan in December 2019 revealed osteoporosis with a T-score of -2.8 in the left hip.  She was placed on anastrozole 1 mg daily for her breast cancer in December 2019.  Due to the osteoporosis, she was also placed on alendronate 70 mg weekly.  She had been taking calcium/vitamin-D daily and we increased this to twice daily.  Bilateral diagnostic mammogram in October 2020 revealed 3 mm group of calcifications at the lumpectomy site in the right breast felt to be likely benign, favoring dystrophic related to fat necrosis.  There was evidence of malignancy in the left breast.    INTERVAL HISTORY:  I have reviewed her chart and materials related to her cancer extensively and collaborated history with the patient. Summary of oncologic history is as follows: Oncology History  Breast cancer of upper-outer quadrant of right female breast ([facility])  05/14/2018 Mammogram   SCREENING BILATERAL MAMMOGRAM: Further evaluation is suggested for possible mass in the right breast.   06/04/2018 Mammogram   DIAGNOSTIC RIGHT MAMMOGRAM AND RIGHT BREAST ULTRASOUND:  Targeted ultrasound is performed, showing angulated hypoechoic mass at the right breast 11:30 o'clock 5 cm from nipple measuring 1.8 x 1.4 x 1.9 cm correlating to the mass seen on mammogram. This correlates to the mass seen on mammogram. At the right breast 10 o'clock 2 cm from nipple, there is a 0.6 x 0.3 x 0.6 cm hypoechoic slight irregular border mass. The 2 masses are separated by 3.4 cm. Ultrasound of the right axilla is negative   06/10/2018 Cancer Staging   Staging form: Breast, AJCC 8th Edition - Clinical stage from 06/10/2018: Stage IA  (cT1c, cN0(sn), cM0, G2, ER+, PR+, HER2-) - Signed by Derwood Kaplan, MD on 09/02/2020 Histopathologic type: Infiltrating duct carcinoma, NOS Stage prefix: Initial diagnosis Method of lymph node assessment: Sentinel lymph node biopsy Nuclear grade: G2 Multigene prognostic tests performed: None Menopausal status: Postmenopausal Ki-67 (%): 15 Stage used in treatment planning: Yes National guidelines used in treatment planning: Yes Type of national guideline used in treatment planning: NCCN    06/11/2018 Pathology Results   Breast, right, needle core biopsy, 11:30 o'clock, U/S guided:  -  Invasive ductal carcinoma, grade 2, involving all cores and measuring approximately 12 mm in maximal linear dimension  -  Possible focal ductal carcinoma in situ 2.    Breast, right, needle core biopsy, 10 o'clock, U/S guided:  -  Fibroadenoma  -  No atypia, in situ or invasive malignancy identified HER2: Negative (1+) ER: Positive 95% PR: Positive 35% Ki67: 15%    06/18/2018 Initial Diagnosis   Breast cancer in female [facility])  06/23/2018 Pathology Results   Lymph node, sentinel, biopsy, (A) right axilla:  -  One lymph node negative for metastatic carcinoma out of a total of one submitted (0/1) 2.    Breast, lumpectomy, (B) right:  -  Invasive ductal carcinoma, grade 2, and intermediate grade ductal carcinoma in situ  -  Biopsy site identified  -  Fibroadenoma  -  Margins free of neoplasm    06/2018 -  Anti-estrogen oral therapy   Anastrozole 1 mg daily   05/22/2021 Mammogram   DIAGNOSTIC BILATERAL MAMMOGRAM: No evidence of malignancy within either breast. Stable postsurgical changes within the RIGHT breast.     Leslie Parsons is here for follow up and states that she has been well.  She had a fall and fractured her left forearm in April after she lost her balance.  She did have a bone density scan also in April and this reveals osteoporosis of the left hip and osteopenia of the spine.   However I do not have prior studies here to compare to.  She had stopped taking her alendronate and so I have recommended denosumab injections every 6 months.  I reviewed the schedule and potential toxicities we will go to her insurance for authorization. She continues anastrozole daily without significant difficulty. CBC and CMP are normal. Annual bilateral mammogram from October 2022 was clear and she has followed up with Dr. Lilia Pro. Her  appetite is good, and she has gained 2 pounds since her last visit.  She denies fever, chills or other signs of infection.  She denies nausea, vomiting, bowel issues, or abdominal pain.  She denies sore throat, cough, dyspnea, or chest pain.  HISTORY:   Allergies:  Allergies  Allergen Reactions   Codeine Other (See Comments)    Doesn't tolerate    Current Medications: Current Outpatient Medications  Medication Sig Dispense Refill   anastrozole (ARIMIDEX) 1 MG tablet Take 1 mg by mouth every evening.     aspirin 325 MG tablet Take 325 mg by mouth every evening.     benazepril-hydrochlorthiazide (LOTENSIN HCT) 20-12.5 MG tablet Take 1 tablet by mouth every evening.     Calcium Carb-Cholecalciferol (CALCIUM+D3 PO) Take 1 tablet by mouth every evening.     ondansetron (ZOFRAN) 4 MG tablet Take 1 tablet (4 mg total) by mouth every 8 (eight) hours as needed for nausea or vomiting. 20 tablet 0   oxyCODONE-acetaminophen (PERCOCET) 5-325 MG tablet Take 1 tablet by mouth every 4 (four) hours as needed for severe pain. 20 tablet 0   No current facility-administered medications for this visit.    REVIEW OF SYSTEMS:  Review of Systems  Constitutional: Negative.  Negative for appetite change, chills, fatigue, fever and unexpected weight change.  HENT:  Negative.    Eyes: Negative.   Respiratory: Negative.  Negative for chest tightness, cough, hemoptysis, shortness of breath and wheezing.   Cardiovascular: Negative.  Negative for chest pain, leg swelling and  palpitations.  Gastrointestinal: Negative.  Negative for abdominal distention, abdominal pain, blood in stool, constipation, diarrhea, nausea and vomiting.  Endocrine: Negative.   Genitourinary: Negative.  Negative for difficulty urinating, dysuria, frequency and hematuria.   Musculoskeletal: Negative.  Negative for arthralgias, back pain, flank pain, gait problem and myalgias.  Skin: Negative.   Neurological: Negative.  Negative for dizziness, extremity weakness, gait problem, headaches, light-headedness, numbness, seizures and speech difficulty.  Hematological: Negative.   Psychiatric/Behavioral: Negative.  Negative for depression and sleep disturbance. The patient is not nervous/anxious.  VITALS:  Blood pressure (!) 171/84, pulse 73, temperature 98.7 F (37.1 C), resp. rate 16, height '5\' 5"'  (1.651 m), weight 147 lb 11.2 oz (67 kg), SpO2 95 %.  Wt Readings from Last 3 Encounters:  12/04/21 147 lb 11.2 oz (67 kg)  08/16/21 142 lb 6.4 oz (64.6 kg)  06/05/21 145 lb 6.4 oz (66 kg)    Body mass index is 24.58 kg/m.  Performance status (ECOG): 0 - Asymptomatic  PHYSICAL EXAM:  Physical Exam Constitutional:      General: She is not in acute distress.    Appearance: Normal appearance. She is normal weight.  HENT:     Head: Normocephalic and atraumatic.  Eyes:     General: No scleral icterus.    Extraocular Movements: Extraocular movements intact.     Conjunctiva/sclera: Conjunctivae normal.     Pupils: Pupils are equal, round, and reactive to light.  Cardiovascular:     Rate and Rhythm: Normal rate and regular rhythm.     Pulses: Normal pulses.     Heart sounds: Normal heart sounds. No murmur heard.   No friction rub. No gallop.  Pulmonary:     Effort: Pulmonary effort is normal. No respiratory distress.     Breath sounds: Normal breath sounds.  Chest:     Comments: Bilateral breasts are without masses. Abdominal:     General: Bowel sounds are normal. There is no  distension.     Palpations: Abdomen is soft. There is no hepatomegaly, splenomegaly or mass.     Tenderness: There is no abdominal tenderness.  Musculoskeletal:        General: Normal range of motion.     Cervical back: Normal range of motion and neck supple.     Right lower leg: No edema.     Left lower leg: No edema.  Lymphadenopathy:     Cervical: No cervical adenopathy.  Skin:    General: Skin is warm and dry.  Neurological:     General: No focal deficit present.     Mental Status: She is alert and oriented to person, place, and time. Mental status is at baseline.  Psychiatric:        Mood and Affect: Mood normal.        Behavior: Behavior normal.        Thought Content: Thought content normal.        Judgment: Judgment normal.     LABS:      Latest Ref Rng & Units 12/04/2021   12:00 AM 08/16/2021    6:08 AM  CBC  WBC  4.5        Hemoglobin 12.0 - 16.0 12.6      13.3    Hematocrit 36 - 46 38      39.0    Platelets 150 - 400 K/uL 280           This result is from an external source.      Latest Ref Rng & Units 12/04/2021   12:00 AM 08/16/2021    6:08 AM  CMP  Glucose 70 - 99 mg/dL  100    BUN 4 - '21 14      14    ' Creatinine 0.5 - 1.1 0.7      0.80    Sodium 137 - 147 137      138    Potassium 3.5 - 5.1 mEq/L 4.5      3.7    Chloride 99 - 108 98  101    CO2 13 - 22 29        Calcium 8.7 - 10.7 10.2        Alkaline Phos 25 - 125 83        AST 13 - 35 27        ALT 7 - 35 U/L 18           This result is from an external source.    STUDIES:

## 2021-12-05 ENCOUNTER — Other Ambulatory Visit: Payer: Self-pay | Admitting: Pharmacist

## 2021-12-06 ENCOUNTER — Other Ambulatory Visit: Payer: Self-pay | Admitting: Oncology

## 2021-12-06 ENCOUNTER — Telehealth: Payer: Self-pay | Admitting: Oncology

## 2021-12-06 DIAGNOSIS — Z170 Estrogen receptor positive status [ER+]: Secondary | ICD-10-CM

## 2021-12-06 NOTE — Telephone Encounter (Signed)
Contacted pt to schedule an appt. Unable to reach via phone, voicemail was left. ? ? ?RE: prolia ?Received: Today ?Corene Cornea sent to Derwood Kaplan, MD; Naaman Plummer Beverly Gust, [place]; Neva Seat; [place], [place] ?Authorizations on file; ready to schedule   ?  ?   ?Previous Messages ?  ?----- Message -----  ?From: Derwood Kaplan, MD  ?Sent: 12/04/2021   6:00 PM EDT  ?To: Juanetta Beets, RPH, Corene Cornea, *  ?Subject: prolia                                        ? ?She has worsening osteoporosis on anastazole and had fall with fractured wrist.  She needs Prolia.  ?I discussed with her and daughter. Pls get auth and schedule, then I need to see 6 months later for appt, labs and Prolia  ?

## 2021-12-08 ENCOUNTER — Telehealth: Payer: Self-pay | Admitting: Oncology

## 2021-12-08 NOTE — Telephone Encounter (Signed)
Contacted pt to schedule an appt. Unable to reach via phone, voicemail was left on home vm since mobile number did not give me an option to lvm (error came up)   Status Change Notification  The following message was taken by Neva Seat. Scheduling Message Entered by Juanetta Beets on 12/05/2021 at  3:58 PM Priority: Routine <No visit type provided>  Department: [facility]  Provider:   Appointment Notes:  Please schedule pt for prolia injection on 5/22.  This date may change if insurance has not authorized by this date.  Pt has had labs already.  Scheduling Notes:

## 2021-12-24 ENCOUNTER — Encounter: Payer: Self-pay | Admitting: Oncology

## 2021-12-25 DIAGNOSIS — S52602A Unspecified fracture of lower end of left ulna, initial encounter for closed fracture: Secondary | ICD-10-CM | POA: Diagnosis not present

## 2021-12-25 DIAGNOSIS — S52502A Unspecified fracture of the lower end of left radius, initial encounter for closed fracture: Secondary | ICD-10-CM | POA: Diagnosis not present

## 2022-01-17 DIAGNOSIS — H524 Presbyopia: Secondary | ICD-10-CM | POA: Diagnosis not present

## 2022-01-17 DIAGNOSIS — H26493 Other secondary cataract, bilateral: Secondary | ICD-10-CM | POA: Diagnosis not present

## 2022-01-17 DIAGNOSIS — Z961 Presence of intraocular lens: Secondary | ICD-10-CM | POA: Diagnosis not present

## 2022-01-17 DIAGNOSIS — H401130 Primary open-angle glaucoma, bilateral, stage unspecified: Secondary | ICD-10-CM | POA: Diagnosis not present

## 2022-01-31 ENCOUNTER — Other Ambulatory Visit: Payer: Self-pay | Admitting: Oncology

## 2022-02-06 DIAGNOSIS — S52502A Unspecified fracture of the lower end of left radius, initial encounter for closed fracture: Secondary | ICD-10-CM | POA: Diagnosis not present

## 2022-02-06 DIAGNOSIS — S52602A Unspecified fracture of lower end of left ulna, initial encounter for closed fracture: Secondary | ICD-10-CM | POA: Diagnosis not present

## 2022-05-03 DIAGNOSIS — Z1331 Encounter for screening for depression: Secondary | ICD-10-CM | POA: Diagnosis not present

## 2022-05-03 DIAGNOSIS — Z0000 Encounter for general adult medical examination without abnormal findings: Secondary | ICD-10-CM | POA: Diagnosis not present

## 2022-05-03 DIAGNOSIS — Z6825 Body mass index (BMI) 25.0-25.9, adult: Secondary | ICD-10-CM | POA: Diagnosis not present

## 2022-05-03 DIAGNOSIS — L259 Unspecified contact dermatitis, unspecified cause: Secondary | ICD-10-CM | POA: Diagnosis not present

## 2022-05-03 DIAGNOSIS — Z9181 History of falling: Secondary | ICD-10-CM | POA: Diagnosis not present

## 2022-05-03 DIAGNOSIS — I10 Essential (primary) hypertension: Secondary | ICD-10-CM | POA: Diagnosis not present

## 2022-05-23 DIAGNOSIS — Z853 Personal history of malignant neoplasm of breast: Secondary | ICD-10-CM | POA: Diagnosis not present

## 2022-05-23 DIAGNOSIS — R921 Mammographic calcification found on diagnostic imaging of breast: Secondary | ICD-10-CM | POA: Diagnosis not present

## 2022-05-29 DIAGNOSIS — C50411 Malignant neoplasm of upper-outer quadrant of right female breast: Secondary | ICD-10-CM | POA: Diagnosis not present

## 2022-05-29 DIAGNOSIS — Z170 Estrogen receptor positive status [ER+]: Secondary | ICD-10-CM | POA: Diagnosis not present

## 2022-06-05 NOTE — Progress Notes (Shared)
ADDENDUM:   Due to osteoporosis on her current bone density scan and a fall with a fracture of her left forearm, I recommended Prolia injections every 6 months.  We were able to get authorization from her insurance but she refused to have the injection scheduled.  I went back to her prior bone density of December 2019 and her left hip had a T score of -2.8 for osteoporosis.  The current one from April 2023 is even worse with a T score of -3.4.  If she is not willing to consider Prolia injections or resuming alendronate, we may need to think about stopping her anastrozole prior to 5 years when we weigh the risk versus benefit.  We will plan to discuss this with her when she returns in 6 months.   [facility]  [address] [place],  [place]  [address] [phone]  Clinic Day: 06/07/2022  Referring physician: Ronita Hipps, MD  ASSESSMENT & PLAN:   1. Stage IA hormone receptor positive right breast cancer diagnosed in October 2019.  She remains without evidence of recurrence.  She knows to continue anastrozole for least a total of 5 years, until December 2024.  2. Osteoporosis.  She was on alendronate 70 mg weekly but discontinued this. She does continue oral calcium supplement, and she will be starting high dose vitamin D.  A repeat bone density in April 2023.  She knows to continue anastrozole daily.  She will be scheduled for denosumab injection, and I will call her after we obtain the authorization from her insurance.  This would be given every 6 months and I reviewed the potential toxicities with her.  She will then return in 6 months with CBC and comprehensive metabolic profile for her second Prolia injection.  The patient understands the plans discussed today and is in agreement with them.  She knows to contact our office if she develops concerns regarding her breast cancer or its treatment.    I provided 30 minutes of face-to-face time during this this  encounter and > 50% was spent counseling as documented under my assessment and plan.    ADDENDUM: We were able to obtain authorization from her insurance for the Prolia injections.  However when we called the patient to schedule this, she refused to proceed.  I will discuss this with her again at her next appointment and we may want to consider stopping the anastrozole sooner if she has no therapy for her bones.  I will see if I can find a comparison baseline to see how much it has changed.   Derwood Kaplan, MD [facility] [address] [place] [address] Dept: [phone] Dept Fax: [phone]   CHIEF COMPLAINT:  CC: Stage IA hormone receptor positive right breast cancer  Current Treatment:  Anastrozole 1 mg daily for 5 years   HISTORY OF PRESENT ILLNESS:  Leslie Parsons is a 81 y.o. female with stage IA (T1c N0 M0) hormone receptor positive right breast cancer diagnosed in November 2019.  Screening mammogram in October 2019 revealed a possible mass on the right side.  Right diagnostic mammogram in November 2019 revealed a spiculated mass in the upper outer quadrant.  Ultrasound revealed an angulated hypoechoic mass at 11:30, which was 5 cm from the nipple and measured 1.9 cm.  There was a second lesion at 10 o'clock, measuring 0.6 cm, which was hypoechoic and slightly irregular.  These 2 masses were separated by 3.4 cm.  Right axillary ultrasound was negative.  Ultrasound-guided biopsy of the larger lesion at 11:30 revealed grade 2, invasive ductal carcinoma involving all cores, measuring up to 12 mm in length, as well as focal ductal carcinoma in situ.  Estrogen receptors were positive at 95% and progesterone receptors 35%.  HER 2 was negative.  Ki 67 was 15%.  Biopsy of the lesion at 10 o'clock revealed a fibroadenoma.  She was treated with lumpectomy and sentinel lymph node in December.  Final pathology  revealed a 19 mm, grade 2, invasive ductal carcinoma with intermediate grade ductal carcinoma in situ.  One sentinel node was negative for metastasis.  Margins were clear .  The fibroadenoma was resected as well.  She developed a postoperative seroma in the right axilla, which which was drained and did not recur.  Bone density scan in December 2019 revealed osteoporosis with a T-score of -2.8 in the left hip.  She was placed on anastrozole 1 mg daily for her breast cancer in December 2019.  Due to the osteoporosis, she was also placed on alendronate 70 mg weekly.  She had been taking calcium/vitamin-D daily and we increased this to twice daily.  Bilateral diagnostic mammogram in October 2020 revealed 3 mm group of calcifications at the lumpectomy site in the right breast felt to be likely benign, favoring dystrophic related to fat necrosis.  There was evidence of malignancy in the left breast.    INTERVAL HISTORY:  I have reviewed her chart and materials related to her cancer extensively and collaborated history with the patient. Summary of oncologic history is as follows: Oncology History  Breast cancer of upper-outer quadrant of right female breast ([facility])  05/14/2018 Mammogram   SCREENING BILATERAL MAMMOGRAM: Further evaluation is suggested for possible mass in the right breast.   06/04/2018 Mammogram   DIAGNOSTIC RIGHT MAMMOGRAM AND RIGHT BREAST ULTRASOUND:  Targeted ultrasound is performed, showing angulated hypoechoic mass at the right breast 11:30 o'clock 5 cm from nipple measuring 1.8 x 1.4 x 1.9 cm correlating to the mass seen on mammogram. This correlates to the mass seen on mammogram. At the right breast 10 o'clock 2 cm from nipple, there is a 0.6 x 0.3 x 0.6 cm hypoechoic slight irregular border mass. The 2 masses are separated by 3.4 cm. Ultrasound of the right axilla is negative   06/10/2018 Cancer Staging   Staging form: Breast, AJCC 8th Edition - Clinical stage from 06/10/2018: Stage IA  (cT1c, cN0(sn), cM0, G2, ER+, PR+, HER2-) - Signed by Derwood Kaplan, MD on 09/02/2020 Histopathologic type: Infiltrating duct carcinoma, NOS Stage prefix: Initial diagnosis Method of lymph node assessment: Sentinel lymph node biopsy Nuclear grade: G2 Multigene prognostic tests performed: None Menopausal status: Postmenopausal Ki-67 (%): 15 Stage used in treatment planning: Yes National guidelines used in treatment planning: Yes Type of national guideline used in treatment planning: NCCN   06/11/2018 Pathology Results   Breast, right, needle core biopsy, 11:30 o'clock, U/S guided:  -  Invasive ductal carcinoma, grade 2, involving all cores and measuring approximately 12 mm in maximal linear dimension  -  Possible focal ductal carcinoma in situ 2.    Breast, right, needle core biopsy, 10 o'clock, U/S guided:  -  Fibroadenoma  -  No atypia, in situ or invasive malignancy identified HER2: Negative (1+) ER: Positive 95% PR: Positive 35% Ki67: 15%    06/18/2018 Initial Diagnosis   Breast cancer in female [facility])  06/23/2018 Pathology Results   Lymph node, sentinel, biopsy, (A) right axilla:  -  One lymph node negative for metastatic carcinoma out of a total of one submitted (0/1) 2.    Breast, lumpectomy, (B) right:  -  Invasive ductal carcinoma, grade 2, and intermediate grade ductal carcinoma in situ  -  Biopsy site identified  -  Fibroadenoma  -  Margins free of neoplasm    06/2018 -  Anti-estrogen oral therapy   Anastrozole 1 mg daily   05/22/2021 Mammogram   DIAGNOSTIC BILATERAL MAMMOGRAM: No evidence of malignancy within either breast. Stable postsurgical changes within the RIGHT breast.     Leslie Parsons is here for follow up and states that she has been well.  She had a fall and fractured her left forearm in April after she lost her balance.  She did have a bone density scan also in April and this reveals osteoporosis of the left hip and osteopenia of the spine.  However  I do not have prior studies here to compare to.  She had stopped taking her alendronate and so I have recommended denosumab injections every 6 months.  I reviewed the schedule and potential toxicities we will go to her insurance for authorization. She continues anastrozole daily without significant difficulty. CBC and CMP are normal. Annual bilateral mammogram from October 2022 was clear and she has followed up with Dr. Lilia Pro. Her  appetite is good, and she has gained 2 pounds since her last visit.  She denies fever, chills or other signs of infection.  She denies nausea, vomiting, bowel issues, or abdominal pain.  She denies sore throat, cough, dyspnea, or chest pain.  HISTORY:   Allergies:  Allergies  Allergen Reactions   Codeine Other (See Comments)    Doesn't tolerate    Current Medications: Current Outpatient Medications  Medication Sig Dispense Refill   anastrozole (ARIMIDEX) 1 MG tablet TAKE 1 TABLET EVERY DAY 90 tablet 3   aspirin 325 MG tablet Take 325 mg by mouth every evening.     benazepril-hydrochlorthiazide (LOTENSIN HCT) 20-12.5 MG tablet Take 1 tablet by mouth every evening.     Calcium Carb-Cholecalciferol (CALCIUM+D3 PO) Take 1 tablet by mouth every evening.     ondansetron (ZOFRAN) 4 MG tablet Take 1 tablet (4 mg total) by mouth every 8 (eight) hours as needed for nausea or vomiting. 20 tablet 0   oxyCODONE-acetaminophen (PERCOCET) 5-325 MG tablet Take 1 tablet by mouth every 4 (four) hours as needed for severe pain. 20 tablet 0   No current facility-administered medications for this visit.    REVIEW OF SYSTEMS:  Review of Systems  Constitutional: Negative.  Negative for appetite change, chills, fatigue, fever and unexpected weight change.  HENT:  Negative.    Eyes: Negative.   Respiratory: Negative.  Negative for chest tightness, cough, hemoptysis, shortness of breath and wheezing.   Cardiovascular: Negative.  Negative for chest pain, leg swelling and palpitations.   Gastrointestinal: Negative.  Negative for abdominal distention, abdominal pain, blood in stool, constipation, diarrhea, nausea and vomiting.  Endocrine: Negative.   Genitourinary: Negative.  Negative for difficulty urinating, dysuria, frequency and hematuria.   Musculoskeletal: Negative.  Negative for arthralgias, back pain, flank pain, gait problem and myalgias.  Skin: Negative.   Neurological: Negative.  Negative for dizziness, extremity weakness, gait problem, headaches, light-headedness, numbness, seizures and speech difficulty.  Hematological: Negative.   Psychiatric/Behavioral: Negative.  Negative for depression and sleep disturbance. The patient is not nervous/anxious.  VITALS:  There were no vitals taken for this visit.  Wt Readings from Last 3 Encounters:  12/04/21 147 lb 11.2 oz (67 kg)  08/16/21 142 lb 6.4 oz (64.6 kg)  06/05/21 145 lb 6.4 oz (66 kg)    There is no height or weight on file to calculate BMI.  Performance status (ECOG): 0 - Asymptomatic  PHYSICAL EXAM:  Physical Exam Constitutional:      General: She is not in acute distress.    Appearance: Normal appearance. She is normal weight.  HENT:     Head: Normocephalic and atraumatic.  Eyes:     General: No scleral icterus.    Extraocular Movements: Extraocular movements intact.     Conjunctiva/sclera: Conjunctivae normal.     Pupils: Pupils are equal, round, and reactive to light.  Cardiovascular:     Rate and Rhythm: Normal rate and regular rhythm.     Pulses: Normal pulses.     Heart sounds: Normal heart sounds. No murmur heard.    No friction rub. No gallop.  Pulmonary:     Effort: Pulmonary effort is normal. No respiratory distress.     Breath sounds: Normal breath sounds.  Chest:     Comments: Bilateral breasts are without masses. Abdominal:     General: Bowel sounds are normal. There is no distension.     Palpations: Abdomen is soft. There is no hepatomegaly, splenomegaly or mass.      Tenderness: There is no abdominal tenderness.  Musculoskeletal:        General: Normal range of motion.     Cervical back: Normal range of motion and neck supple.     Right lower leg: No edema.     Left lower leg: No edema.  Lymphadenopathy:     Cervical: No cervical adenopathy.  Skin:    General: Skin is warm and dry.  Neurological:     General: No focal deficit present.     Mental Status: She is alert and oriented to person, place, and time. Mental status is at baseline.  Psychiatric:        Mood and Affect: Mood normal.        Behavior: Behavior normal.        Thought Content: Thought content normal.        Judgment: Judgment normal.      LABS:      Latest Ref Rng & Units 12/04/2021   12:00 AM 08/16/2021    6:08 AM  CBC  WBC  4.5       Hemoglobin 12.0 - 16.0 12.6     13.3   Hematocrit 36 - 46 38     39.0   Platelets 150 - 400 K/uL 280          This result is from an external source.       Latest Ref Rng & Units 12/04/2021   12:00 AM 08/16/2021    6:08 AM  CMP  Glucose 70 - 99 mg/dL  100   BUN 4 - _0 Creatinine 0.5 - 1.1 0.7     0.80   Sodium 137 - 147 137     138   Potassium 3.5 - 5.1 mEq/L 4.5     3.7   Chloride 99 - 108 98     101   CO2 13 - 22 29       Calcium 8.7 - 10.7 10.2  Alkaline Phos 25 - 125 83       AST 13 - 35 27       ALT 7 - 35 U/L 18          This result is from an external source.     STUDIES:

## 2022-06-07 ENCOUNTER — Other Ambulatory Visit: Payer: BC Managed Care – PPO

## 2022-06-07 ENCOUNTER — Ambulatory Visit: Payer: BC Managed Care – PPO | Admitting: Oncology

## 2022-06-11 ENCOUNTER — Ambulatory Visit: Payer: BC Managed Care – PPO

## 2022-08-15 DIAGNOSIS — H401130 Primary open-angle glaucoma, bilateral, stage unspecified: Secondary | ICD-10-CM | POA: Diagnosis not present

## 2022-08-15 DIAGNOSIS — H26493 Other secondary cataract, bilateral: Secondary | ICD-10-CM | POA: Diagnosis not present

## 2022-08-15 DIAGNOSIS — H524 Presbyopia: Secondary | ICD-10-CM | POA: Diagnosis not present

## 2022-08-15 DIAGNOSIS — Z961 Presence of intraocular lens: Secondary | ICD-10-CM | POA: Diagnosis not present

## 2022-08-15 DIAGNOSIS — H43812 Vitreous degeneration, left eye: Secondary | ICD-10-CM | POA: Diagnosis not present

## 2022-11-26 ENCOUNTER — Ambulatory Visit: Payer: BC Managed Care – PPO | Admitting: Oncology

## 2022-11-26 ENCOUNTER — Other Ambulatory Visit: Payer: BC Managed Care – PPO

## 2022-11-27 NOTE — Progress Notes (Signed)
[facility]  [address] [place],  [place]  [id number] [phone]  Clinic Day: 11/28/22  Referring physician: Marylen Ponto, MD  ASSESSMENT & PLAN:   1. Stage IA hormone receptor positive right breast cancer diagnosed in October 2019.  She remains without evidence of recurrence.  She knows to continue anastrozole for least a total of 5 years, until December 2024.  2. Osteoporosis.  She was on alendronate 70 mg weekly but discontinued this. She does continue oral calcium supplement, and high dose vitamin D.  A repeat bone density in April 2023 revealed worsening osteoporosis, with a T score of -3.4 of the left femur. She has declined the Prolia injections.  Plan She knows to continue anastrozole daily until December 2024, and we will plan to remind her to stop it then.  She has declined the denosumab injection, even though we obtained the authorization from her insurance. I will see her back in 1 year with repeat bone density scan. The patient understands the plans discussed today and is in agreement with them.  She knows to contact our office if she develops concerns regarding her breast cancer or its treatment.     I provided 20 minutes of face-to-face time during this this encounter and > 50% was spent counseling as documented under my assessment and plan.    Dellia Beckwith, MD [facility] AT [facility] [address] [place] [address] Dept: [phone] Dept Fax: [phone]   CHIEF COMPLAINT:  CC: Stage IA hormone receptor positive right breast cancer  Current Treatment:  Anastrozole 1 mg daily for 5 years  HISTORY OF PRESENT ILLNESS:  Leslie Parsons is a 82 y.o. female with stage IA (T1c N0 M0) hormone receptor positive right breast cancer diagnosed in November 2019.  Screening mammogram in October 2019 revealed a possible mass on the right side.  Right diagnostic  mammogram in November 2019 revealed a spiculated mass in the upper outer quadrant.  Ultrasound revealed an angulated hypoechoic mass at 11:30, which was 5 cm from the nipple and measured 1.9 cm.  There was a second lesion at 10 o'clock, measuring 0.6 cm, which was hypoechoic and slightly irregular.  These 2 masses were separated by 3.4 cm.  Right axillary ultrasound was negative.  Ultrasound-guided biopsy of the larger lesion at 11:30 revealed grade 2, invasive ductal carcinoma involving all cores, measuring up to 12 mm in length, as well as focal ductal carcinoma in situ.  Estrogen receptors were positive at 95% and progesterone receptors 35%.  HER 2 was negative.  Ki 67 was 15%.  Biopsy of the lesion at 10 o'clock revealed a fibroadenoma.  She was treated with lumpectomy and sentinel lymph node in December.  Final pathology revealed a 19 mm, grade 2, invasive ductal carcinoma with intermediate grade ductal carcinoma in situ.  One sentinel node was negative for metastasis.  Margins were clear .  The fibroadenoma was resected as well.  She developed a postoperative seroma in the right axilla, which which was drained and did not recur.  Bone density scan in December 2019 revealed osteoporosis with a T-score of -2.8 in the left hip.  She was placed on anastrozole 1 mg daily for her breast cancer in December 2019.  Due to the osteoporosis, she was also placed on alendronate 70 mg weekly.  She had been taking calcium/vitamin-D daily and we increased this to twice daily.  Bilateral diagnostic mammogram in October 2020 revealed 3 mm group of calcifications at the lumpectomy site in the right breast felt to be likely benign, favoring dystrophic related to fat necrosis.  There was evidence of malignancy in the left breast.    I have reviewed her chart and materials related to her cancer extensively and collaborated history with the patient. Summary of oncologic history is as follows: Oncology History  Breast cancer of  upper-outer quadrant of right female breast (HCC)  05/14/2018 Mammogram   SCREENING BILATERAL MAMMOGRAM: Further evaluation is suggested for possible mass in the right breast.   06/04/2018 Mammogram   DIAGNOSTIC RIGHT MAMMOGRAM AND RIGHT BREAST ULTRASOUND:  Targeted ultrasound is performed, showing angulated hypoechoic mass at the right breast 11:30 o'clock 5 cm from nipple measuring 1.8 x 1.4 x 1.9 cm correlating to the mass seen on mammogram. This correlates to the mass seen on mammogram. At the right breast 10 o'clock 2 cm from nipple, there is a 0.6 x 0.3 x 0.6 cm hypoechoic slight irregular border mass. The 2 masses are separated by 3.4 cm. Ultrasound of the right axilla is negative   06/10/2018 Cancer Staging   Staging form: Breast, AJCC 8th Edition - Clinical stage from 06/10/2018: Stage IA (cT1c, cN0(sn), cM0, G2, ER+, PR+, HER2-) - Signed by Dellia Beckwith, MD on 09/02/2020 Histopathologic type: Infiltrating duct carcinoma, NOS Stage prefix: Initial diagnosis Method of lymph node assessment: Sentinel lymph node biopsy Nuclear grade: G2 Multigene prognostic tests performed: None Menopausal status: Postmenopausal Ki-67 (%): 15 Stage used in treatment planning: Yes National guidelines used in treatment planning: Yes Type of national guideline used in treatment planning: NCCN   06/11/2018 Pathology Results   Breast, right, needle core biopsy, 11:30 o'clock, U/S guided:  -  Invasive ductal carcinoma, grade 2, involving all cores and measuring approximately 12 mm in maximal linear dimension  -  Possible focal ductal carcinoma in situ 2.    Breast, right, needle core biopsy, 10 o'clock, U/S guided:  -  Fibroadenoma  -  No atypia, in situ or invasive malignancy identified HER2: Negative (1+) ER: Positive 95% PR: Positive 35% Ki67: 15%    06/18/2018 Initial Diagnosis   Breast cancer in female [facility])   06/23/2018 Pathology Results   Lymph node, sentinel, biopsy, (A) right  axilla:  -  One lymph node negative for metastatic carcinoma out of a total of one submitted (0/1) 2.    Breast, lumpectomy, (B) right:  -  Invasive ductal carcinoma, grade 2, and intermediate grade ductal carcinoma in situ  -  Biopsy site identified  -  Fibroadenoma  -  Margins free of neoplasm    06/2018 -  Anti-estrogen oral therapy   Anastrozole 1 mg daily   05/22/2021 Mammogram   DIAGNOSTIC BILATERAL MAMMOGRAM: No evidence of malignancy within either breast. Stable postsurgical changes within the RIGHT breast.     INTERVAL HISTORY:  She is here for follow up of her stage IA breast cancer diagnosed in November of 2019 and treated with surgery and hormonal therapy. She has been on anastrozole and will complete 5 years in December of 2024. She feels well and denies complaints. She had a fall in April of 2023 with fracture of her arm.  She did have a bone density scan also in April and this reveals osteoporosis of the left hip and osteopenia of the spine. The T score of her femur had worsened from -2.8 to -3.4. She had stopped taking her alendronate  and so I recommended denosumab injections every 6 months.  I reviewed the schedule and potential toxicities and we obtained authorization from her insurance. However she refused the denosumab. She continues anastrozole daily without significant difficulty.  She denies signs of infection such as sore throat, sinus drainage, cough, or urinary symptoms.  She denies fevers or recurrent chills. She denies pain. She denies nausea, vomiting, chest pain, dyspnea or cough. Her appetite is good and her weight has increased 2 pounds over last year .  I will see her back in 1 year with repeat bone density scan.   HISTORY:   Allergies:  Allergies  Allergen Reactions   Codeine Other (See Comments)    Doesn't tolerate    Current Medications: Current Outpatient Medications  Medication Sig Dispense Refill   anastrozole (ARIMIDEX) 1 MG tablet TAKE 1 TABLET  EVERY DAY 90 tablet 3   aspirin 325 MG tablet Take 325 mg by mouth every evening.     benazepril-hydrochlorthiazide (LOTENSIN HCT) 20-12.5 MG tablet Take 1 tablet by mouth every evening.     Calcium Carb-Cholecalciferol (CALCIUM+D3 PO) Take 1 tablet by mouth every evening.     ondansetron (ZOFRAN) 4 MG tablet Take 1 tablet (4 mg total) by mouth every 8 (eight) hours as needed for nausea or vomiting. 20 tablet 0   oxyCODONE-acetaminophen (PERCOCET) 5-325 MG tablet Take 1 tablet by mouth every 4 (four) hours as needed for severe pain. 20 tablet 0   No current facility-administered medications for this visit.    REVIEW OF SYSTEMS:  Review of Systems  Constitutional: Negative.  Negative for appetite change, chills, fatigue, fever and unexpected weight change.  HENT:  Negative.    Eyes: Negative.   Respiratory: Negative.  Negative for chest tightness, cough, hemoptysis, shortness of breath and wheezing.   Cardiovascular: Negative.  Negative for chest pain, leg swelling and palpitations.  Gastrointestinal: Negative.  Negative for abdominal distention, abdominal pain, blood in stool, constipation, diarrhea, nausea and vomiting.  Endocrine: Negative.   Genitourinary: Negative.  Negative for difficulty urinating, dysuria, frequency and hematuria.   Musculoskeletal: Negative.  Negative for arthralgias, back pain, flank pain, gait problem and myalgias.  Skin: Negative.   Neurological: Negative.  Negative for dizziness, extremity weakness, gait problem, headaches, light-headedness, numbness, seizures and speech difficulty.  Hematological: Negative.   Psychiatric/Behavioral: Negative.  Negative for depression and sleep disturbance. The patient is not nervous/anxious.     VITALS:  Blood pressure (!) 161/65, pulse 72, temperature 97.9 F (36.6 C), temperature source Oral, resp. rate 16, weight 149 lb 14.4 oz (68 kg), SpO2 100 %.  Wt Readings from Last 3 Encounters:  11/28/22 149 lb 14.4 oz (68 kg)   12/04/21 147 lb 11.2 oz (67 kg)  08/16/21 142 lb 6.4 oz (64.6 kg)    Body mass index is 24.94 kg/m.  Performance status (ECOG): 0 - Asymptomatic  PHYSICAL EXAM:  Physical Exam Vitals and nursing note reviewed.  Constitutional:      General: She is not in acute distress.    Appearance: Normal appearance. She is normal weight. She is not ill-appearing, toxic-appearing or diaphoretic.  HENT:     Head: Normocephalic and atraumatic.     Right Ear: Tympanic membrane, ear canal and external ear normal. There is no impacted cerumen.     Left Ear: Tympanic membrane, ear canal and external ear normal. There is no impacted cerumen.     Nose: Nose normal. No congestion or rhinorrhea.     Mouth/Throat:  Mouth: Mucous membranes are moist.     Pharynx: Oropharynx is clear. No oropharyngeal exudate or posterior oropharyngeal erythema.  Eyes:     General: No scleral icterus.       Right eye: No discharge.        Left eye: No discharge.     Extraocular Movements: Extraocular movements intact.     Conjunctiva/sclera: Conjunctivae normal.     Pupils: Pupils are equal, round, and reactive to light.  Cardiovascular:     Rate and Rhythm: Normal rate and regular rhythm.     Pulses: Normal pulses.     Heart sounds: Normal heart sounds. No murmur heard.    No friction rub. No gallop.  Pulmonary:     Effort: Pulmonary effort is normal. No respiratory distress.     Breath sounds: Normal breath sounds.  Chest:     Comments: Bilateral breasts are without masses. She has a well healed scar in the upper outer quadrant of the right breast. Abdominal:     General: Bowel sounds are normal. There is no distension.     Palpations: Abdomen is soft. There is no hepatomegaly, splenomegaly or mass.     Tenderness: There is no abdominal tenderness.  Musculoskeletal:        General: Normal range of motion.     Cervical back: Normal range of motion and neck supple.     Right lower leg: No edema.     Left  lower leg: No edema.  Lymphadenopathy:     Cervical: No cervical adenopathy.  Skin:    General: Skin is warm and dry.  Neurological:     General: No focal deficit present.     Mental Status: She is alert and oriented to person, place, and time. Mental status is at baseline.     Cranial Nerves: No cranial nerve deficit.     Sensory: No sensory deficit.     Motor: No weakness.     Coordination: Coordination normal.     Gait: Gait normal.     Deep Tendon Reflexes: Reflexes normal.  Psychiatric:        Mood and Affect: Mood normal.        Behavior: Behavior normal.        Thought Content: Thought content normal.        Judgment: Judgment normal.   LABS:      Latest Ref Rng & Units 11/28/2022    3:55 PM 12/04/2021   12:00 AM 08/16/2021    6:08 AM  CBC  WBC 4.0 - 10.5 K/uL 4.5  4.5       Hemoglobin 12.0 - 15.0 g/dL 60.4  54.0     98.1   Hematocrit 36.0 - 46.0 % 36.5  38     39.0   Platelets 150 - 400 K/uL 267  280          This result is from an external source.      Latest Ref Rng & Units 11/28/2022    3:55 PM 12/04/2021   12:00 AM 08/16/2021    6:08 AM  CMP  Glucose 70 - 99 mg/dL 97   191   BUN 8 - 23 mg/dL 15  14     14    Creatinine 0.44 - 1.00 mg/dL 4.78  0.7     2.95   Sodium 135 - 145 mmol/L 134  137     138   Potassium 3.5 - 5.1 mmol/L 3.7  4.5  3.7   Chloride 98 - 111 mmol/L 98  98     101   CO2 22 - 32 mmol/L 27  29       Calcium 8.9 - 10.3 mg/dL 9.1  16.1       Total Protein 6.5 - 8.1 g/dL 6.5     Total Bilirubin 0.3 - 1.2 mg/dL 0.3     Alkaline Phos 38 - 126 U/L 60  83       AST 15 - 41 U/L 20  27       ALT 0 - 44 U/L 17  18          This result is from an external source.   STUDIES:    I,Jasmine M Lassiter,acting as a scribe for Dellia Beckwith, MD.,have documented all relevant documentation on the behalf of Dellia Beckwith, MD,as directed by  Dellia Beckwith, MD while in the presence of Dellia Beckwith, MD.

## 2022-11-28 ENCOUNTER — Inpatient Hospital Stay: Payer: Medicare HMO | Attending: Oncology

## 2022-11-28 ENCOUNTER — Encounter: Payer: Self-pay | Admitting: Oncology

## 2022-11-28 ENCOUNTER — Other Ambulatory Visit: Payer: Self-pay | Admitting: Oncology

## 2022-11-28 ENCOUNTER — Inpatient Hospital Stay (INDEPENDENT_AMBULATORY_CARE_PROVIDER_SITE_OTHER): Payer: Medicare HMO | Admitting: Oncology

## 2022-11-28 VITALS — BP 161/65 | HR 72 | Temp 97.9°F | Resp 16 | Wt 149.9 lb

## 2022-11-28 DIAGNOSIS — M818 Other osteoporosis without current pathological fracture: Secondary | ICD-10-CM

## 2022-11-28 DIAGNOSIS — Z170 Estrogen receptor positive status [ER+]: Secondary | ICD-10-CM | POA: Diagnosis not present

## 2022-11-28 DIAGNOSIS — Z7983 Long term (current) use of bisphosphonates: Secondary | ICD-10-CM | POA: Insufficient documentation

## 2022-11-28 DIAGNOSIS — C50411 Malignant neoplasm of upper-outer quadrant of right female breast: Secondary | ICD-10-CM | POA: Insufficient documentation

## 2022-11-28 DIAGNOSIS — M8588 Other specified disorders of bone density and structure, other site: Secondary | ICD-10-CM | POA: Insufficient documentation

## 2022-11-28 DIAGNOSIS — Z885 Allergy status to narcotic agent status: Secondary | ICD-10-CM | POA: Insufficient documentation

## 2022-11-28 DIAGNOSIS — Z79899 Other long term (current) drug therapy: Secondary | ICD-10-CM | POA: Diagnosis not present

## 2022-11-28 DIAGNOSIS — Z79811 Long term (current) use of aromatase inhibitors: Secondary | ICD-10-CM | POA: Diagnosis not present

## 2022-11-28 LAB — CBC WITH DIFFERENTIAL (CANCER CENTER ONLY)
Abs Immature Granulocytes: 0 10*3/uL (ref 0.00–0.07)
Basophils Absolute: 0.1 10*3/uL (ref 0.0–0.1)
Basophils Relative: 1 %
Eosinophils Absolute: 0.1 10*3/uL (ref 0.0–0.5)
Eosinophils Relative: 2 %
HCT: 36.5 % (ref 36.0–46.0)
Hemoglobin: 12 g/dL (ref 12.0–15.0)
Immature Granulocytes: 0 %
Lymphocytes Relative: 38 %
Lymphs Abs: 1.7 10*3/uL (ref 0.7–4.0)
MCH: 29.9 pg (ref 26.0–34.0)
MCHC: 32.9 g/dL (ref 30.0–36.0)
MCV: 90.8 fL (ref 80.0–100.0)
Monocytes Absolute: 0.5 10*3/uL (ref 0.1–1.0)
Monocytes Relative: 10 %
Neutro Abs: 2.2 10*3/uL (ref 1.7–7.7)
Neutrophils Relative %: 49 %
Platelet Count: 267 10*3/uL (ref 150–400)
RBC: 4.02 MIL/uL (ref 3.87–5.11)
RDW: 12.5 % (ref 11.5–15.5)
WBC Count: 4.5 10*3/uL (ref 4.0–10.5)
nRBC: 0 % (ref 0.0–0.2)

## 2022-11-28 LAB — CMP (CANCER CENTER ONLY)
ALT: 17 U/L (ref 0–44)
AST: 20 U/L (ref 15–41)
Albumin: 3.9 g/dL (ref 3.5–5.0)
Alkaline Phosphatase: 60 U/L (ref 38–126)
Anion gap: 9 (ref 5–15)
BUN: 15 mg/dL (ref 8–23)
CO2: 27 mmol/L (ref 22–32)
Calcium: 9.1 mg/dL (ref 8.9–10.3)
Chloride: 98 mmol/L (ref 98–111)
Creatinine: 1.02 mg/dL — ABNORMAL HIGH (ref 0.44–1.00)
GFR, Estimated: 55 mL/min — ABNORMAL LOW (ref >60)
Glucose, Bld: 97 mg/dL (ref 70–99)
Potassium: 3.7 mmol/L (ref 3.5–5.1)
Sodium: 134 mmol/L — ABNORMAL LOW (ref 135–145)
Total Bilirubin: 0.3 mg/dL (ref 0.3–1.2)
Total Protein: 6.5 g/dL (ref 6.5–8.1)

## 2022-12-10 ENCOUNTER — Telehealth: Payer: Self-pay

## 2022-12-10 NOTE — Telephone Encounter (Signed)
-----   Message from Dellia Beckwith, MD sent at 12/05/2022  8:28 PM EDT ----- Regarding: call Tell her labs all look great except mildly low salt level

## 2022-12-10 NOTE — Telephone Encounter (Signed)
Attempted to contact patient. No answer. 

## 2022-12-10 NOTE — Telephone Encounter (Signed)
Patient notified of lab results

## 2022-12-10 NOTE — Telephone Encounter (Signed)
-----   Message from Christine H McCarty, MD sent at 12/05/2022  8:28 PM EDT ----- Regarding: call Tell her labs all look great except mildly low salt level  

## 2022-12-26 DIAGNOSIS — K0520 Aggressive periodontitis, unspecified: Secondary | ICD-10-CM | POA: Diagnosis not present

## 2022-12-26 DIAGNOSIS — K122 Cellulitis and abscess of mouth: Secondary | ICD-10-CM | POA: Diagnosis not present

## 2023-02-19 DIAGNOSIS — H26493 Other secondary cataract, bilateral: Secondary | ICD-10-CM | POA: Diagnosis not present

## 2023-02-19 DIAGNOSIS — H401130 Primary open-angle glaucoma, bilateral, stage unspecified: Secondary | ICD-10-CM | POA: Diagnosis not present

## 2023-02-19 DIAGNOSIS — H43812 Vitreous degeneration, left eye: Secondary | ICD-10-CM | POA: Diagnosis not present

## 2023-02-19 DIAGNOSIS — Z961 Presence of intraocular lens: Secondary | ICD-10-CM | POA: Diagnosis not present

## 2023-03-01 DIAGNOSIS — Z961 Presence of intraocular lens: Secondary | ICD-10-CM | POA: Diagnosis not present

## 2023-03-01 DIAGNOSIS — H43812 Vitreous degeneration, left eye: Secondary | ICD-10-CM | POA: Diagnosis not present

## 2023-03-01 DIAGNOSIS — H401130 Primary open-angle glaucoma, bilateral, stage unspecified: Secondary | ICD-10-CM | POA: Diagnosis not present

## 2023-03-01 DIAGNOSIS — I10 Essential (primary) hypertension: Secondary | ICD-10-CM | POA: Diagnosis not present

## 2023-03-01 DIAGNOSIS — H26493 Other secondary cataract, bilateral: Secondary | ICD-10-CM | POA: Diagnosis not present

## 2023-03-01 DIAGNOSIS — H401132 Primary open-angle glaucoma, bilateral, moderate stage: Secondary | ICD-10-CM | POA: Diagnosis not present

## 2023-03-01 DIAGNOSIS — H26491 Other secondary cataract, right eye: Secondary | ICD-10-CM | POA: Diagnosis not present

## 2023-03-15 DIAGNOSIS — H401130 Primary open-angle glaucoma, bilateral, stage unspecified: Secondary | ICD-10-CM | POA: Diagnosis not present

## 2023-03-15 DIAGNOSIS — Z961 Presence of intraocular lens: Secondary | ICD-10-CM | POA: Diagnosis not present

## 2023-03-15 DIAGNOSIS — H43812 Vitreous degeneration, left eye: Secondary | ICD-10-CM | POA: Diagnosis not present

## 2023-03-15 DIAGNOSIS — H26491 Other secondary cataract, right eye: Secondary | ICD-10-CM | POA: Diagnosis not present

## 2023-03-15 DIAGNOSIS — H26492 Other secondary cataract, left eye: Secondary | ICD-10-CM | POA: Diagnosis not present

## 2023-05-06 DIAGNOSIS — Z6825 Body mass index (BMI) 25.0-25.9, adult: Secondary | ICD-10-CM | POA: Diagnosis not present

## 2023-05-06 DIAGNOSIS — Z79899 Other long term (current) drug therapy: Secondary | ICD-10-CM | POA: Diagnosis not present

## 2023-05-06 DIAGNOSIS — Z1331 Encounter for screening for depression: Secondary | ICD-10-CM | POA: Diagnosis not present

## 2023-05-06 DIAGNOSIS — I10 Essential (primary) hypertension: Secondary | ICD-10-CM | POA: Diagnosis not present

## 2023-05-06 DIAGNOSIS — Z0000 Encounter for general adult medical examination without abnormal findings: Secondary | ICD-10-CM | POA: Diagnosis not present

## 2023-05-06 DIAGNOSIS — Z2821 Immunization not carried out because of patient refusal: Secondary | ICD-10-CM | POA: Diagnosis not present

## 2023-05-10 DIAGNOSIS — N3091 Cystitis, unspecified with hematuria: Secondary | ICD-10-CM | POA: Diagnosis not present

## 2023-05-10 DIAGNOSIS — N3001 Acute cystitis with hematuria: Secondary | ICD-10-CM | POA: Diagnosis not present

## 2023-05-27 DIAGNOSIS — Z1231 Encounter for screening mammogram for malignant neoplasm of breast: Secondary | ICD-10-CM | POA: Diagnosis not present

## 2023-06-04 DIAGNOSIS — C50411 Malignant neoplasm of upper-outer quadrant of right female breast: Secondary | ICD-10-CM | POA: Diagnosis not present

## 2023-06-04 DIAGNOSIS — Z170 Estrogen receptor positive status [ER+]: Secondary | ICD-10-CM | POA: Diagnosis not present

## 2023-07-03 ENCOUNTER — Telehealth: Payer: Self-pay

## 2023-07-03 NOTE — Telephone Encounter (Signed)
Attempted to contact patient. No answer. 

## 2023-07-03 NOTE — Telephone Encounter (Signed)
-----   Message from Leslie Parsons sent at 12/12/2022  7:41 AM EDT ----- Regarding: anastrozole Remind her to stop anastrozole in Dec 2024

## 2023-10-08 DIAGNOSIS — H401130 Primary open-angle glaucoma, bilateral, stage unspecified: Secondary | ICD-10-CM | POA: Diagnosis not present

## 2023-10-08 DIAGNOSIS — H43812 Vitreous degeneration, left eye: Secondary | ICD-10-CM | POA: Diagnosis not present

## 2023-10-08 DIAGNOSIS — Z961 Presence of intraocular lens: Secondary | ICD-10-CM | POA: Diagnosis not present

## 2023-10-25 DIAGNOSIS — L821 Other seborrheic keratosis: Secondary | ICD-10-CM | POA: Diagnosis not present

## 2023-10-25 DIAGNOSIS — L814 Other melanin hyperpigmentation: Secondary | ICD-10-CM | POA: Diagnosis not present

## 2023-10-25 DIAGNOSIS — L820 Inflamed seborrheic keratosis: Secondary | ICD-10-CM | POA: Diagnosis not present

## 2023-11-27 ENCOUNTER — Inpatient Hospital Stay: Payer: Medicare HMO | Admitting: Oncology

## 2023-11-27 NOTE — Progress Notes (Incomplete)
 [facility]  [address] [place],  [place]  [address] [phone]  Clinic Day: 11/27/23   Referring physician: Gaither Juba, MD  ASSESSMENT & PLAN:  Assessment: 1. Stage IA hormone receptor positive right breast cancer diagnosed in October 2019.  She remains without evidence of recurrence.  She knows to continue anastrozole for least a total of 5 years, until December 2024.  2. Osteoporosis.  She was on alendronate 70 mg weekly but discontinued this. She does continue oral calcium supplement, and high dose vitamin D .  A repeat bone density in April 2023 revealed worsening osteoporosis, with a T score of -3.4 of the left femur. She has declined the Prolia injections.  Plan She knows to continue anastrozole daily until December 2024, and we will plan to remind her to stop it then.  She has declined the denosumab injection, even though we obtained the authorization from her insurance. I will see her back in 1 year with repeat bone density scan. The patient understands the plans discussed today and is in agreement with them.  She knows to contact our office if she develops concerns regarding her breast cancer or its treatment.     I provided 20 minutes of face-to-face time during this this encounter and > 50% was spent counseling as documented under my assessment and plan.   Nolia Baumgartner, MD  [facility] OF [facility] [address] [place] [address] Dept: [phone] Dept Fax: [phone]   No orders of the defined types were placed in this encounter.   CHIEF COMPLAINT:  CC: Stage IA hormone receptor positive right breast cancer  Current Treatment:  Anastrozole 1 mg daily for 5 years  HISTORY OF PRESENT ILLNESS:  Leslie Parsons is a 83 y.o. female with stage IA (T1c N0 M0) hormone receptor positive right breast cancer diagnosed in November 2019.  Screening mammogram in October  2019 revealed a possible mass on the right side.  Right diagnostic mammogram in November 2019 revealed a spiculated mass in the upper outer quadrant.  Ultrasound revealed an angulated hypoechoic mass at 11:30, which was 5 cm from the nipple and measured 1.9 cm.  There was a second lesion at 10 o'clock, measuring 0.6 cm, which was hypoechoic and slightly irregular.  These 2 masses were separated by 3.4 cm.  Right axillary ultrasound was negative.  Ultrasound-guided biopsy of the larger lesion at 11:30 revealed grade 2, invasive ductal carcinoma involving all cores, measuring up to 12 mm in length, as well as focal ductal carcinoma in situ.  Estrogen receptors were positive at 95% and progesterone receptors 35%.  HER 2 was negative.  Ki 67 was 15%.  Biopsy of the lesion at 10 o'clock revealed a fibroadenoma.  She was treated with lumpectomy and sentinel lymph node in December.  Final pathology revealed a 19 mm, grade 2, invasive ductal carcinoma with intermediate grade ductal carcinoma in situ.  One sentinel node was negative for metastasis.  Margins were clear .  The fibroadenoma was resected as well.  She developed a postoperative seroma in the right axilla, which which was drained and did not recur.  Bone density scan in December 2019 revealed osteoporosis with a T-score of -2.8 in the left hip.  She was placed on anastrozole 1 mg daily for her breast cancer in December 2019.  Due to the osteoporosis, she was also placed on alendronate  70 mg weekly.  She had been taking calcium/vitamin-D daily and we increased this to twice daily.  Bilateral diagnostic mammogram in October 2020 revealed 3 mm group of calcifications at the lumpectomy site in the right breast felt to be likely benign, favoring dystrophic related to fat necrosis.  There was evidence of malignancy in the left breast.    I have reviewed her chart and materials related to her cancer extensively and collaborated history with the patient. Summary of  oncologic history is as follows: Oncology History  Breast cancer of upper-outer quadrant of right female breast (HCC)  05/14/2018 Mammogram   SCREENING BILATERAL MAMMOGRAM: Further evaluation is suggested for possible mass in the right breast.   06/04/2018 Mammogram   DIAGNOSTIC RIGHT MAMMOGRAM AND RIGHT BREAST ULTRASOUND:  Targeted ultrasound is performed, showing angulated hypoechoic mass at the right breast 11:30 o'clock 5 cm from nipple measuring 1.8 x 1.4 x 1.9 cm correlating to the mass seen on mammogram. This correlates to the mass seen on mammogram. At the right breast 10 o'clock 2 cm from nipple, there is a 0.6 x 0.3 x 0.6 cm hypoechoic slight irregular border mass. The 2 masses are separated by 3.4 cm. Ultrasound of the right axilla is negative   06/10/2018 Cancer Staging   Staging form: Breast, AJCC 8th Edition - Clinical stage from 06/10/2018: Stage IA (cT1c, cN0(sn), cM0, G2, ER+, PR+, HER2-) - Signed by Nolia Baumgartner, MD on 09/02/2020 Histopathologic type: Infiltrating duct carcinoma, NOS Stage prefix: Initial diagnosis Method of lymph node assessment: Sentinel lymph node biopsy Nuclear grade: G2 Multigene prognostic tests performed: None Menopausal status: Postmenopausal Ki-67 (%): 15 Stage used in treatment planning: Yes National guidelines used in treatment planning: Yes Type of national guideline used in treatment planning: NCCN   06/11/2018 Pathology Results   Breast, right, needle core biopsy, 11:30 o'clock, U/S guided:  -  Invasive ductal carcinoma, grade 2, involving all cores and measuring approximately 12 mm in maximal linear dimension  -  Possible focal ductal carcinoma in situ 2.    Breast, right, needle core biopsy, 10 o'clock, U/S guided:  -  Fibroadenoma  -  No atypia, in situ or invasive malignancy identified HER2: Negative (1+) ER: Positive 95% PR: Positive 35% Ki67: 15%    06/18/2018 Initial Diagnosis   Breast cancer in female [facility])    06/23/2018 Pathology Results   Lymph node, sentinel, biopsy, (A) right axilla:  -  One lymph node negative for metastatic carcinoma out of a total of one submitted (0/1) 2.    Breast, lumpectomy, (B) right:  -  Invasive ductal carcinoma, grade 2, and intermediate grade ductal carcinoma in situ  -  Biopsy site identified  -  Fibroadenoma  -  Margins free of neoplasm    06/2018 -  Anti-estrogen oral therapy   Anastrozole 1 mg daily   05/22/2021 Mammogram   DIAGNOSTIC BILATERAL MAMMOGRAM: No evidence of malignancy within either breast. Stable postsurgical changes within the RIGHT breast.     INTERVAL HISTORY:  She is here for follow up of her stage IA breast cancer diagnosed in November of 2019 and treated with surgery and hormonal therapy. She has been on anastrozole and will complete 5 years in December of 2024. Patient states that she feels *** and ***.       She denies fever, chills, night sweats, or other signs of infection. She denies cardiorespiratory and gastrointestinal issues. She  denies pain. Her appetite is *** and her weight {Weight  change:10426}.  She feels well and denies complaints. She had a fall in April of 2023 with fracture of her arm.  She did have a bone density scan also in April and this reveals osteoporosis of the left hip and osteopenia of the spine. The T score of her femur had worsened from -2.8 to -3.4. She had stopped taking her alendronate and so I recommended denosumab injections every 6 months.  I reviewed the schedule and potential toxicities and we obtained authorization from her insurance. However she refused the denosumab. She continues anastrozole daily without significant difficulty.  She denies signs of infection such as sore throat, sinus drainage, cough, or urinary symptoms.  She denies fevers or recurrent chills. She denies pain. She denies nausea, vomiting, chest pain, dyspnea or cough. Her appetite is good and her weight has increased 2 pounds  over last year .  I will see her back in 1 year with repeat bone density scan.   HISTORY:   Allergies:  Allergies  Allergen Reactions   Codeine Other (See Comments)    Doesn't tolerate    Current Medications: Current Outpatient Medications  Medication Sig Dispense Refill   anastrozole (ARIMIDEX) 1 MG tablet TAKE 1 TABLET EVERY DAY 90 tablet 3   aspirin 325 MG tablet Take 325 mg by mouth every evening.     benazepril-hydrochlorthiazide (LOTENSIN HCT) 20-12.5 MG tablet Take 1 tablet by mouth every evening.     Calcium Carb-Cholecalciferol (CALCIUM+D3 PO) Take 1 tablet by mouth every evening.     ondansetron  (ZOFRAN ) 4 MG tablet Take 1 tablet (4 mg total) by mouth every 8 (eight) hours as needed for nausea or vomiting. 20 tablet 0   oxyCODONE -acetaminophen  (PERCOCET) 5-325 MG tablet Take 1 tablet by mouth every 4 (four) hours as needed for severe pain. 20 tablet 0   No current facility-administered medications for this visit.    REVIEW OF SYSTEMS:  Review of Systems  Constitutional: Negative.  Negative for appetite change, chills, fatigue, fever and unexpected weight change.  HENT:  Negative.  Negative for lump/mass, mouth sores and sore throat.   Eyes: Negative.   Respiratory: Negative.  Negative for chest tightness, cough, hemoptysis, shortness of breath and wheezing.   Cardiovascular: Negative.  Negative for chest pain, leg swelling and palpitations.  Gastrointestinal: Negative.  Negative for abdominal distention, abdominal pain, blood in stool, constipation, diarrhea, nausea and vomiting.  Endocrine: Negative.   Genitourinary: Negative.  Negative for difficulty urinating, dysuria, frequency and hematuria.   Musculoskeletal: Negative.  Negative for arthralgias, back pain, flank pain, gait problem and myalgias.  Skin: Negative.   Neurological: Negative.  Negative for dizziness, extremity weakness, gait problem, headaches, light-headedness, numbness, seizures and speech difficulty.   Hematological: Negative.  Negative for adenopathy. Does not bruise/bleed easily.  Psychiatric/Behavioral: Negative.  Negative for depression and sleep disturbance. The patient is not nervous/anxious.     VITALS:  There were no vitals taken for this visit.  Wt Readings from Last 3 Encounters:  11/28/22 149 lb 14.4 oz (68 kg)  12/04/21 147 lb 11.2 oz (67 kg)  08/16/21 142 lb 6.4 oz (64.6 kg)    There is no height or weight on file to calculate BMI.  Performance status (ECOG): 0 - Asymptomatic  PHYSICAL EXAM:  Physical Exam Vitals and nursing note reviewed.  Constitutional:      General: She is not in acute distress.    Appearance: Normal appearance. She is normal weight. She is not ill-appearing, toxic-appearing or diaphoretic.  HENT:     Head: Normocephalic and atraumatic.     Right Ear: Tympanic membrane, ear canal and external ear normal. There is no impacted cerumen.     Left Ear: Tympanic membrane, ear canal and external ear normal. There is no impacted cerumen.     Nose: Nose normal. No congestion or rhinorrhea.     Mouth/Throat:     Mouth: Mucous membranes are moist.     Pharynx: Oropharynx is clear. No oropharyngeal exudate or posterior oropharyngeal erythema.  Eyes:     General: No scleral icterus.       Right eye: No discharge.        Left eye: No discharge.     Extraocular Movements: Extraocular movements intact.     Conjunctiva/sclera: Conjunctivae normal.     Pupils: Pupils are equal, round, and reactive to light.  Cardiovascular:     Rate and Rhythm: Normal rate and regular rhythm.     Pulses: Normal pulses.     Heart sounds: Normal heart sounds. No murmur heard.    No friction rub. No gallop.  Pulmonary:     Effort: Pulmonary effort is normal. No respiratory distress.     Breath sounds: Normal breath sounds.  Chest:     Comments: Bilateral breasts are without masses. She has a well healed scar in the upper outer quadrant of the right breast. Abdominal:      General: Bowel sounds are normal. There is no distension.     Palpations: Abdomen is soft. There is no hepatomegaly, splenomegaly or mass.     Tenderness: There is no abdominal tenderness.  Musculoskeletal:        General: Normal range of motion.     Cervical back: Normal range of motion and neck supple.     Right lower leg: No edema.     Left lower leg: No edema.  Lymphadenopathy:     Cervical: No cervical adenopathy.  Skin:    General: Skin is warm and dry.  Neurological:     General: No focal deficit present.     Mental Status: She is alert and oriented to person, place, and time. Mental status is at baseline.     Cranial Nerves: No cranial nerve deficit.     Sensory: No sensory deficit.     Motor: No weakness.     Coordination: Coordination normal.     Gait: Gait normal.     Deep Tendon Reflexes: Reflexes normal.  Psychiatric:        Mood and Affect: Mood normal.        Behavior: Behavior normal.        Thought Content: Thought content normal.        Judgment: Judgment normal.    LABS:      Latest Ref Rng & Units 11/28/2022    3:55 PM 12/04/2021   12:00 AM 08/16/2021    6:08 AM  CBC  WBC 4.0 - 10.5 K/uL 4.5  4.5       Hemoglobin 12.0 - 15.0 g/dL 47.8  29.5     62.1   Hematocrit 36.0 - 46.0 % 36.5  38     39.0   Platelets 150 - 400 K/uL 267  280          This result is from an external source.      Latest Ref Rng & Units 11/28/2022    3:55 PM 12/04/2021   12:00 AM 08/16/2021    6:08 AM  CMP  Glucose 70 - 99 mg/dL 97   191   BUN 8 - 23 mg/dL 15  14     14    Creatinine 0.44 - 1.00 mg/dL 4.78  0.7     2.95   Sodium 135 - 145 mmol/L 134  137     138   Potassium 3.5 - 5.1 mmol/L 3.7  4.5     3.7   Chloride 98 - 111 mmol/L 98  98     101   CO2 22 - 32 mmol/L 27  29       Calcium 8.9 - 10.3 mg/dL 9.1  62.1       Total Protein 6.5 - 8.1 g/dL 6.5     Total Bilirubin 0.3 - 1.2 mg/dL 0.3     Alkaline Phos 38 - 126 U/L 60  83       AST 15 - 41 U/L 20  27       ALT 0 - 44  U/L 17  18          This result is from an external source.   STUDIES:      I,Jasmine M Lassiter,acting as a scribe for Nolia Baumgartner, MD.,have documented all relevant documentation on the behalf of Nolia Baumgartner, MD,as directed by  Nolia Baumgartner, MD while in the presence of Nolia Baumgartner, MD.

## 2023-12-06 ENCOUNTER — Inpatient Hospital Stay: Attending: Oncology | Admitting: Oncology

## 2023-12-06 ENCOUNTER — Encounter: Payer: Self-pay | Admitting: Oncology

## 2023-12-06 VITALS — BP 166/71 | HR 72 | Temp 98.1°F | Resp 18 | Ht 65.0 in | Wt 147.3 lb

## 2023-12-06 DIAGNOSIS — Z885 Allergy status to narcotic agent status: Secondary | ICD-10-CM | POA: Insufficient documentation

## 2023-12-06 DIAGNOSIS — Z79899 Other long term (current) drug therapy: Secondary | ICD-10-CM | POA: Insufficient documentation

## 2023-12-06 DIAGNOSIS — Z7982 Long term (current) use of aspirin: Secondary | ICD-10-CM | POA: Insufficient documentation

## 2023-12-06 DIAGNOSIS — Z853 Personal history of malignant neoplasm of breast: Secondary | ICD-10-CM | POA: Diagnosis not present

## 2023-12-06 DIAGNOSIS — Z170 Estrogen receptor positive status [ER+]: Secondary | ICD-10-CM | POA: Diagnosis not present

## 2023-12-06 DIAGNOSIS — C50411 Malignant neoplasm of upper-outer quadrant of right female breast: Secondary | ICD-10-CM

## 2023-12-06 NOTE — Progress Notes (Signed)
 [facility]  [address] [place],  [place]  [address] [phone]  Clinic Day: 12/06/23  Referring physician: Gaither Juba, MD  ASSESSMENT & PLAN:  Assessment: 1. Stage IA hormone receptor positive right breast cancer diagnosed in October 2019.  She remains without evidence of recurrence.  She has completed taking anastrozole for a total of 5 years, in December 2024.  2. Osteoporosis.  She was on alendronate 70 mg weekly but discontinued this. She does continue oral calcium supplement, and high dose vitamin D .  A repeat bone density in April 2023 revealed worsening osteoporosis, with a T score of -3.4 of the left femur. She has declined the Prolia injections and any further bone density scans.  Plan: She has completed 5 years of Anastrozole in December of 2024. She is past due for a bone density scan but declines wanting to have one done. She will be due for routine mammogram in 6 months that will be scheduled by Dr. Britta Candy and her PCP does routine labs. I will see her back in 1 year for reevaluation. The patient understands the plans discussed today and is in agreement with them.  She knows to contact our office if she develops concerns regarding her breast cancer or its treatment.     I provided 8 minutes of face-to-face time during this this encounter and > 50% was spent counseling as documented under my assessment and plan.   Nolia Baumgartner, MD  [facility] DEPT OF [facility] [address] [place] [address] Dept: [phone] Dept Fax: [phone]   No orders of the defined types were placed in this encounter.  CHIEF COMPLAINT:  CC: Stage IA hormone receptor positive right breast cancer  Current Treatment:  Anastrozole 1 mg daily for 5 years  HISTORY OF PRESENT ILLNESS:  Leslie Parsons is a 83 y.o. female with stage IA (T1c N0 M0) hormone receptor positive right breast cancer  diagnosed in November 2019.  Screening mammogram in October 2019 revealed a possible mass on the right side.  Right diagnostic mammogram in November 2019 revealed a spiculated mass in the upper outer quadrant.  Ultrasound revealed an angulated hypoechoic mass at 11:30, which was 5 cm from the nipple and measured 1.9 cm.  There was a second lesion at 10 o'clock, measuring 0.6 cm, which was hypoechoic and slightly irregular.  These 2 masses were separated by 3.4 cm.  Right axillary ultrasound was negative.  Ultrasound-guided biopsy of the larger lesion at 11:30 revealed grade 2, invasive ductal carcinoma involving all cores, measuring up to 12 mm in length, as well as focal ductal carcinoma in situ.  Estrogen receptors were positive at 95% and progesterone receptors 35%.  HER 2 was negative.  Ki 67 was 15%.  Biopsy of the lesion at 10 o'clock revealed a fibroadenoma.  She was treated with lumpectomy and sentinel lymph node in December.  Final pathology revealed a 19 mm, grade 2, invasive ductal carcinoma with intermediate grade ductal carcinoma in situ.  One sentinel node was negative for metastasis.  Margins were clear .  The fibroadenoma was resected as well.  She developed a postoperative seroma in the right axilla, which which was drained and did not recur.  Bone density scan in December 2019 revealed osteoporosis with a T-score of -2.8 in the left hip.  She was placed on anastrozole 1 mg daily for her breast cancer  in December 2019.  Due to the osteoporosis, she was also placed on alendronate 70 mg weekly.  She had been taking calcium/vitamin-D daily and we increased this to twice daily.  Bilateral diagnostic mammogram in October 2020 revealed 3 mm group of calcifications at the lumpectomy site in the right breast felt to be likely benign, favoring dystrophic related to fat necrosis.  There was evidence of malignancy in the left breast.    I have reviewed her chart and materials related to her cancer  extensively and collaborated history with the patient. Summary of oncologic history is as follows: Oncology History  Breast cancer of upper-outer quadrant of right female breast (HCC)  05/14/2018 Mammogram   SCREENING BILATERAL MAMMOGRAM: Further evaluation is suggested for possible mass in the right breast.   06/04/2018 Mammogram   DIAGNOSTIC RIGHT MAMMOGRAM AND RIGHT BREAST ULTRASOUND:  Targeted ultrasound is performed, showing angulated hypoechoic mass at the right breast 11:30 o'clock 5 cm from nipple measuring 1.8 x 1.4 x 1.9 cm correlating to the mass seen on mammogram. This correlates to the mass seen on mammogram. At the right breast 10 o'clock 2 cm from nipple, there is a 0.6 x 0.3 x 0.6 cm hypoechoic slight irregular border mass. The 2 masses are separated by 3.4 cm. Ultrasound of the right axilla is negative   06/10/2018 Cancer Staging   Staging form: Breast, AJCC 8th Edition - Clinical stage from 06/10/2018: Stage IA (cT1c, cN0(sn), cM0, G2, ER+, PR+, HER2-) - Signed by Nolia Baumgartner, MD on 09/02/2020 Histopathologic type: Infiltrating duct carcinoma, NOS Stage prefix: Initial diagnosis Method of lymph node assessment: Sentinel lymph node biopsy Nuclear grade: G2 Multigene prognostic tests performed: None Menopausal status: Postmenopausal Ki-67 (%): 15 Stage used in treatment planning: Yes National guidelines used in treatment planning: Yes Type of national guideline used in treatment planning: NCCN   06/11/2018 Pathology Results   Breast, right, needle core biopsy, 11:30 o'clock, U/S guided:  -  Invasive ductal carcinoma, grade 2, involving all cores and measuring approximately 12 mm in maximal linear dimension  -  Possible focal ductal carcinoma in situ 2.    Breast, right, needle core biopsy, 10 o'clock, U/S guided:  -  Fibroadenoma  -  No atypia, in situ or invasive malignancy identified HER2: Negative (1+) ER: Positive 95% PR: Positive 35% Ki67: 15%     06/18/2018 Initial Diagnosis   Breast cancer in female [facility])   06/23/2018 Pathology Results   Lymph node, sentinel, biopsy, (A) right axilla:  -  One lymph node negative for metastatic carcinoma out of a total of one submitted (0/1) 2.    Breast, lumpectomy, (B) right:  -  Invasive ductal carcinoma, grade 2, and intermediate grade ductal carcinoma in situ  -  Biopsy site identified  -  Fibroadenoma  -  Margins free of neoplasm    06/2018 -  Anti-estrogen oral therapy   Anastrozole 1 mg daily   05/22/2021 Mammogram   DIAGNOSTIC BILATERAL MAMMOGRAM: No evidence of malignancy within either breast. Stable postsurgical changes within the RIGHT breast.    INTERVAL HISTORY:  She is here for follow up of her stage IA breast cancer diagnosed in November of 2019 and treated with surgery and hormonal therapy. Patient states that she feels well and has no complaints of pain. She has completed 5 years of Anastrozole in December of 2024. She is past due for a bone density scan but declines to have one done. She will be due for routine  mammogram in 6 months and that will be scheduled by Dr. Britta Candy and her PCP does routine labs. I will see her back in 1 year for reevaluation.   She denies fever, chills, night sweats, or other signs of infection. She denies cardiorespiratory and gastrointestinal issues. She denies pain. Her appetite is good and Her weight has decreased 2 pounds over last year.    HISTORY:   Allergies  Allergen Reactions   Codeine Other (See Comments)    Doesn't tolerate   Current Outpatient Medications  Medication Sig Dispense Refill   aspirin 325 MG tablet Take 325 mg by mouth every evening.     benazepril-hydrochlorthiazide (LOTENSIN HCT) 20-12.5 MG tablet Take 1 tablet by mouth every evening.     Calcium Carb-Cholecalciferol (CALCIUM+D3 PO) Take 1 tablet by mouth every evening.     No current facility-administered medications for this visit.   REVIEW OF SYSTEMS:  Review  of Systems  Constitutional: Negative.  Negative for appetite change, chills, fatigue, fever and unexpected weight change.  HENT:  Negative.  Negative for lump/mass, mouth sores and sore throat.   Eyes: Negative.   Respiratory: Negative.  Negative for chest tightness, cough, hemoptysis, shortness of breath and wheezing.   Cardiovascular: Negative.  Negative for chest pain, leg swelling and palpitations.  Gastrointestinal: Negative.  Negative for abdominal distention, abdominal pain, blood in stool, constipation, diarrhea, nausea and vomiting.  Endocrine: Negative.   Genitourinary: Negative.  Negative for difficulty urinating, dysuria, frequency and hematuria.   Musculoskeletal: Negative.  Negative for arthralgias, back pain, flank pain, gait problem and myalgias.  Skin: Negative.   Neurological: Negative.  Negative for dizziness, extremity weakness, gait problem, headaches, light-headedness, numbness, seizures and speech difficulty.  Hematological: Negative.  Negative for adenopathy. Does not bruise/bleed easily.  Psychiatric/Behavioral: Negative.  Negative for depression and sleep disturbance. The patient is not nervous/anxious.     VITALS:  Blood pressure (!) 166/71, pulse 72, temperature 98.1 F (36.7 C), temperature source Oral, resp. rate 18, height 5\' 5"  (1.651 m), weight 147 lb 4.8 oz (66.8 kg), SpO2 98%.  Wt Readings from Last 3 Encounters:  12/06/23 147 lb 4.8 oz (66.8 kg)  11/28/22 149 lb 14.4 oz (68 kg)  12/04/21 147 lb 11.2 oz (67 kg)    Body mass index is 24.51 kg/m.  Performance status (ECOG): 0 - Asymptomatic  PHYSICAL EXAM:  Physical Exam Vitals and nursing note reviewed.  Constitutional:      General: She is not in acute distress.    Appearance: Normal appearance. She is normal weight. She is not ill-appearing, toxic-appearing or diaphoretic.  HENT:     Head: Normocephalic and atraumatic.     Right Ear: Tympanic membrane, ear canal and external ear normal. There is  no impacted cerumen.     Left Ear: Tympanic membrane, ear canal and external ear normal. There is no impacted cerumen.     Nose: Nose normal. No congestion or rhinorrhea.     Mouth/Throat:     Mouth: Mucous membranes are moist.     Pharynx: Oropharynx is clear. No oropharyngeal exudate or posterior oropharyngeal erythema.  Eyes:     General: No scleral icterus.       Right eye: No discharge.        Left eye: No discharge.     Extraocular Movements: Extraocular movements intact.     Conjunctiva/sclera: Conjunctivae normal.     Pupils: Pupils are equal, round, and reactive to light.  Cardiovascular:  Rate and Rhythm: Normal rate and regular rhythm.     Pulses: Normal pulses.     Heart sounds: Normal heart sounds. No murmur heard.    No friction rub. No gallop.  Pulmonary:     Effort: Pulmonary effort is normal. No respiratory distress.     Breath sounds: Normal breath sounds.  Chest:     Comments: Bilateral breasts are without masses. Well healed scar in the upper outer quadrant of the right breast. Abdominal:     General: Bowel sounds are normal. There is no distension.     Palpations: Abdomen is soft. There is no hepatomegaly, splenomegaly or mass.     Tenderness: There is no abdominal tenderness.  Musculoskeletal:        General: Normal range of motion.     Cervical back: Normal range of motion and neck supple.     Right lower leg: No edema.     Left lower leg: No edema.  Lymphadenopathy:     Cervical: No cervical adenopathy.  Skin:    General: Skin is warm and dry.  Neurological:     General: No focal deficit present.     Mental Status: She is alert and oriented to person, place, and time. Mental status is at baseline.     Cranial Nerves: No cranial nerve deficit.     Sensory: No sensory deficit.     Motor: No weakness.     Coordination: Coordination normal.     Gait: Gait normal.     Deep Tendon Reflexes: Reflexes normal.  Psychiatric:        Mood and Affect: Mood  normal.        Behavior: Behavior normal.        Thought Content: Thought content normal.        Judgment: Judgment normal.    LABS:      Latest Ref Rng & Units 11/28/2022    3:55 PM 12/04/2021   12:00 AM 08/16/2021    6:08 AM  CBC  WBC 4.0 - 10.5 K/uL 4.5  4.5       Hemoglobin 12.0 - 15.0 g/dL 60.4  54.0     98.1   Hematocrit 36.0 - 46.0 % 36.5  38     39.0   Platelets 150 - 400 K/uL 267  280          This result is from an external source.      Latest Ref Rng & Units 11/28/2022    3:55 PM 12/04/2021   12:00 AM 08/16/2021    6:08 AM  CMP  Glucose 70 - 99 mg/dL 97   191   BUN 8 - 23 mg/dL 15  14     14    Creatinine 0.44 - 1.00 mg/dL 4.78  0.7     2.95   Sodium 135 - 145 mmol/L 134  137     138   Potassium 3.5 - 5.1 mmol/L 3.7  4.5     3.7   Chloride 98 - 111 mmol/L 98  98     101   CO2 22 - 32 mmol/L 27  29       Calcium 8.9 - 10.3 mg/dL 9.1  62.1       Total Protein 6.5 - 8.1 g/dL 6.5     Total Bilirubin 0.3 - 1.2 mg/dL 0.3     Alkaline Phos 38 - 126 U/L 60  83       AST 15 -  41 U/L 20  27       ALT 0 - 44 U/L 17  18          This result is from an external source.   STUDIES:      I,Jasmine M Lassiter,acting as a scribe for Nolia Baumgartner, MD.,have documented all relevant documentation on the behalf of Nolia Baumgartner, MD,as directed by  Nolia Baumgartner, MD while in the presence of Nolia Baumgartner, MD.

## 2023-12-09 ENCOUNTER — Telehealth: Payer: Self-pay | Admitting: Oncology

## 2023-12-09 NOTE — Telephone Encounter (Signed)
 Patient has been scheduled for follow-up visit per 12/05/23 LOS.  Pt aware of scheduled appt details.

## 2024-01-15 DIAGNOSIS — R69 Illness, unspecified: Secondary | ICD-10-CM | POA: Diagnosis not present

## 2024-02-24 DIAGNOSIS — R8281 Pyuria: Secondary | ICD-10-CM | POA: Diagnosis not present

## 2024-02-24 DIAGNOSIS — R809 Proteinuria, unspecified: Secondary | ICD-10-CM | POA: Diagnosis not present

## 2024-02-24 DIAGNOSIS — R319 Hematuria, unspecified: Secondary | ICD-10-CM | POA: Diagnosis not present

## 2024-02-24 DIAGNOSIS — R300 Dysuria: Secondary | ICD-10-CM | POA: Diagnosis not present

## 2024-05-12 DIAGNOSIS — Z1331 Encounter for screening for depression: Secondary | ICD-10-CM | POA: Diagnosis not present

## 2024-05-12 DIAGNOSIS — E7800 Pure hypercholesterolemia, unspecified: Secondary | ICD-10-CM | POA: Diagnosis not present

## 2024-05-12 DIAGNOSIS — Z853 Personal history of malignant neoplasm of breast: Secondary | ICD-10-CM | POA: Diagnosis not present

## 2024-05-12 DIAGNOSIS — Z6826 Body mass index (BMI) 26.0-26.9, adult: Secondary | ICD-10-CM | POA: Diagnosis not present

## 2024-05-12 DIAGNOSIS — Z79899 Other long term (current) drug therapy: Secondary | ICD-10-CM | POA: Diagnosis not present

## 2024-05-12 DIAGNOSIS — Z0000 Encounter for general adult medical examination without abnormal findings: Secondary | ICD-10-CM | POA: Diagnosis not present

## 2024-05-12 DIAGNOSIS — I10 Essential (primary) hypertension: Secondary | ICD-10-CM | POA: Diagnosis not present

## 2024-05-12 DIAGNOSIS — Z1339 Encounter for screening examination for other mental health and behavioral disorders: Secondary | ICD-10-CM | POA: Diagnosis not present

## 2024-05-12 DIAGNOSIS — Z2821 Immunization not carried out because of patient refusal: Secondary | ICD-10-CM | POA: Diagnosis not present

## 2024-05-27 DIAGNOSIS — Z1231 Encounter for screening mammogram for malignant neoplasm of breast: Secondary | ICD-10-CM | POA: Diagnosis not present

## 2024-06-03 DIAGNOSIS — C50411 Malignant neoplasm of upper-outer quadrant of right female breast: Secondary | ICD-10-CM | POA: Diagnosis not present

## 2024-06-03 DIAGNOSIS — Z170 Estrogen receptor positive status [ER+]: Secondary | ICD-10-CM | POA: Diagnosis not present

## 2024-12-04 ENCOUNTER — Ambulatory Visit: Admitting: Oncology
